# Patient Record
Sex: Male | Born: 1990 | Race: Black or African American | Hispanic: No | Marital: Single | Smoking: Never smoker
Health system: Southern US, Community
[De-identification: ages and names within clinical notes are randomized; demographics above are authoritative.]

## PROBLEM LIST (undated history)

## (undated) DIAGNOSIS — J069 Acute upper respiratory infection, unspecified: Secondary | ICD-10-CM

## (undated) DIAGNOSIS — E079 Disorder of thyroid, unspecified: Secondary | ICD-10-CM

## (undated) DIAGNOSIS — J45909 Unspecified asthma, uncomplicated: Secondary | ICD-10-CM

## (undated) HISTORY — PX: TONSILLECTOMY: SUR1361

## (undated) HISTORY — DX: Acute upper respiratory infection, unspecified: J06.9

## (undated) HISTORY — PX: TONSILECTOMY, ADENOIDECTOMY, BILATERAL MYRINGOTOMY AND TUBES: SHX2538

## (undated) HISTORY — DX: Unspecified asthma, uncomplicated: J45.909

---

## 2010-02-22 ENCOUNTER — Emergency Department (HOSPITAL_BASED_OUTPATIENT_CLINIC_OR_DEPARTMENT_OTHER): Admission: EM | Admit: 2010-02-22 | Discharge: 2010-02-22 | Payer: Self-pay | Admitting: Emergency Medicine

## 2010-11-07 LAB — URINE CULTURE
Colony Count: NO GROWTH
Culture: NO GROWTH

## 2010-11-07 LAB — URINALYSIS, ROUTINE W REFLEX MICROSCOPIC
Bilirubin Urine: NEGATIVE
Ketones, ur: NEGATIVE mg/dL
Leukocytes, UA: NEGATIVE
Nitrite: NEGATIVE
Protein, ur: 30 mg/dL — AB

## 2010-11-07 LAB — URINE MICROSCOPIC-ADD ON

## 2010-11-07 LAB — GC/CHLAMYDIA PROBE AMP, GENITAL: Chlamydia, DNA Probe: NEGATIVE

## 2011-01-26 ENCOUNTER — Emergency Department (HOSPITAL_BASED_OUTPATIENT_CLINIC_OR_DEPARTMENT_OTHER)
Admission: EM | Admit: 2011-01-26 | Discharge: 2011-01-26 | Disposition: A | Discharge status: Home / Self Care | Payer: Self-pay | Attending: Emergency Medicine | Admitting: Emergency Medicine

## 2011-01-26 ENCOUNTER — Emergency Department (INDEPENDENT_AMBULATORY_CARE_PROVIDER_SITE_OTHER): Payer: Self-pay

## 2011-01-26 DIAGNOSIS — R109 Unspecified abdominal pain: Secondary | ICD-10-CM | POA: Insufficient documentation

## 2011-01-26 DIAGNOSIS — R300 Dysuria: Secondary | ICD-10-CM | POA: Insufficient documentation

## 2011-01-26 DIAGNOSIS — R350 Frequency of micturition: Secondary | ICD-10-CM

## 2011-01-26 DIAGNOSIS — N3010 Interstitial cystitis (chronic) without hematuria: Secondary | ICD-10-CM | POA: Insufficient documentation

## 2011-01-26 DIAGNOSIS — R319 Hematuria, unspecified: Secondary | ICD-10-CM

## 2011-01-26 LAB — URINE MICROSCOPIC-ADD ON

## 2011-01-26 LAB — URINALYSIS, ROUTINE W REFLEX MICROSCOPIC
Bilirubin Urine: NEGATIVE
Ketones, ur: NEGATIVE mg/dL
Nitrite: NEGATIVE
Specific Gravity, Urine: 1.025 (ref 1.005–1.030)
pH: 6.5 (ref 5.0–8.0)

## 2011-01-28 LAB — URINE CULTURE
Colony Count: NO GROWTH
Culture  Setup Time: 201206070630

## 2013-02-23 ENCOUNTER — Emergency Department (HOSPITAL_BASED_OUTPATIENT_CLINIC_OR_DEPARTMENT_OTHER): Payer: Self-pay

## 2013-02-23 ENCOUNTER — Emergency Department (HOSPITAL_BASED_OUTPATIENT_CLINIC_OR_DEPARTMENT_OTHER)
Admission: EM | Admit: 2013-02-23 | Discharge: 2013-02-23 | Disposition: A | Discharge status: Home / Self Care | Payer: Self-pay | Attending: Emergency Medicine | Admitting: Emergency Medicine

## 2013-02-23 ENCOUNTER — Encounter (HOSPITAL_BASED_OUTPATIENT_CLINIC_OR_DEPARTMENT_OTHER): Payer: Self-pay | Admitting: *Deleted

## 2013-02-23 DIAGNOSIS — M542 Cervicalgia: Secondary | ICD-10-CM | POA: Insufficient documentation

## 2013-02-23 DIAGNOSIS — M549 Dorsalgia, unspecified: Secondary | ICD-10-CM | POA: Insufficient documentation

## 2013-02-23 MED ORDER — OXYCODONE-ACETAMINOPHEN 5-325 MG PO TABS: 1.0000 | ORAL_TABLET | ORAL | Status: DC | PRN

## 2013-02-23 MED ORDER — DIAZEPAM 5 MG PO TABS: 5.0000 mg | ORAL_TABLET | Freq: Three times a day (TID) | ORAL | Status: DC

## 2013-02-23 MED ORDER — IBUPROFEN 800 MG PO TABS: 800.0000 mg | ORAL_TABLET | Freq: Three times a day (TID) | ORAL | Status: DC

## 2013-02-23 MED ORDER — OXYCODONE-ACETAMINOPHEN 5-325 MG PO TABS
1.0000 | ORAL_TABLET | Freq: Once | ORAL | Status: AC
  Administered 2013-02-23: 1 via ORAL
  Filled 2013-02-23 (×2): qty 1

## 2013-02-23 MED ORDER — DIAZEPAM 5 MG PO TABS
5.0000 mg | ORAL_TABLET | Freq: Once | ORAL | Status: AC
  Administered 2013-02-23: 5 mg via ORAL
  Filled 2013-02-23: qty 1

## 2013-02-23 NOTE — ED Provider Notes (Signed)
   History    CSN: [id number] Arrival date & time 02/23/13  1722  First MD Initiated Contact with Patient 02/23/13 1759     Chief Complaint  Patient presents with  . Shoulder Injury   (Consider location/radiation/quality/duration/timing/severity/associated sxs/prior Treatment) Patient is a 22 y.o. male presenting with shoulder injury. The history is provided by the patient and a relative. No language interpreter was used.  Shoulder Injury Associated symptoms include neck pain. Pertinent negatives include no chills, fever, numbness or weakness. Associated symptoms comments: Left neck and shoulder pain for the past 3 weeks that became sharp and severe today, radiating into left arm. Hurts worse with movement and deep breathing but not short of breath. No fever. No known injury. No extremity weakness, numbness or tingling. Marland Kitchen   History reviewed. No pertinent past medical history. History reviewed. No pertinent past surgical history. History reviewed. No pertinent family history. History  Substance Use Topics  . Smoking status: Never Smoker   . Smokeless tobacco: Not on file  . Alcohol Use: No    Review of Systems  Constitutional: Negative for fever and chills.  HENT: Positive for neck pain.   Respiratory: Negative.  Negative for shortness of breath.   Cardiovascular: Negative.   Musculoskeletal:       See HPI  Neurological: Negative.  Negative for weakness and numbness.    Allergies  Review of patient's allergies indicates no known allergies.  Home Medications  No current outpatient prescriptions on file. BP 160/86  Pulse 52  Temp(Src) 97.9 F (36.6 C) (Oral)  Resp 18  Ht 6\' 1"  (1.854 m)  Wt 230 lb (104.327 kg)  BMI 30.35 kg/m2  SpO2 100% Physical Exam  Constitutional: He is oriented to person, place, and time. He appears well-developed and well-nourished.  Neck: Normal range of motion.  Cardiovascular: Intact distal pulses.   Pulmonary/Chest: Effort normal.   Musculoskeletal: Normal range of motion.  Minimally tender to upper back at paraspinal area. No swelling. No midline or paracervical tenderness. Guarded full range of motion secondary to discomfort. 5/5 grip strength left UE that is equal to right.   Neurological: He is alert and oriented to person, place, and time.  Skin: Skin is warm and dry.  Psychiatric: He has a normal mood and affect.    ED Course  Procedures (including critical care time) Labs Reviewed - No data to display Dg Shoulder Left  02/23/2013   *RADIOLOGY REPORT*  Clinical Data: Cough and shoulder while stretching.  Pain and numbness.  LEFT SHOULDER - 2+ VIEW  Comparison: None.  Findings: No fracture, dislocation, or acute bony findings observed.  IMPRESSION:  No significant abnormality identified.   Original Report Authenticated By: Gaylyn Rong, M.D.   No diagnosis found. 1. Upper back pain MDM  Better with medications. Negative x-ray ordered by protocol. Sharp increase to pain today after 3 week history of pain causes suspicion for muscle spasm type pain. Will treat conservatively.  Arnoldo Hooker, PA-C 02/23/13 1911

## 2013-02-23 NOTE — ED Notes (Signed)
Pt states he has a hx of back pain and was stretching. Felt "pop" in his left shoulder and "collapsed". C/O pain and numbness to left arm. PMS intact.

## 2013-02-24 NOTE — ED Provider Notes (Signed)
Medical screening examination/treatment/procedure(s) were performed by non-physician practitioner and as supervising physician I was immediately available for consultation/collaboration.  Toy Baker, MD 02/24/13 2038

## 2014-05-02 ENCOUNTER — Ambulatory Visit (INDEPENDENT_AMBULATORY_CARE_PROVIDER_SITE_OTHER): Payer: Self-pay | Admitting: Family Medicine

## 2014-05-02 VITALS — BP 132/58 | HR 67 | Temp 97.7°F | Resp 18 | Ht 74.0 in | Wt 214.6 lb

## 2014-05-02 DIAGNOSIS — Z202 Contact with and (suspected) exposure to infections with a predominantly sexual mode of transmission: Secondary | ICD-10-CM

## 2014-05-02 DIAGNOSIS — R300 Dysuria: Secondary | ICD-10-CM

## 2014-05-02 LAB — POCT URINALYSIS DIPSTICK
Bilirubin, UA: NEGATIVE
Glucose, UA: NEGATIVE
Ketones, UA: NEGATIVE
Leukocytes, UA: NEGATIVE
Nitrite, UA: NEGATIVE
PROTEIN UA: NEGATIVE
SPEC GRAV UA: 1.025
UROBILINOGEN UA: 1
pH, UA: 6.5

## 2014-05-02 LAB — POCT UA - MICROSCOPIC ONLY
Bacteria, U Microscopic: NEGATIVE
CRYSTALS, UR, HPF, POC: NEGATIVE
Casts, Ur, LPF, POC: NEGATIVE
Mucus, UA: NEGATIVE
YEAST UA: NEGATIVE

## 2014-05-02 MED ORDER — AZITHROMYCIN 250 MG PO TABS: ORAL_TABLET | ORAL | Status: DC

## 2014-05-02 NOTE — Progress Notes (Signed)
Subjective: 23 year old man with a history 7-10 days of some dysuria. He has noticed a difference in the other. He wonders whether he had a little discharge this morning. He been using a powder and stopped using that and then he noticed this discharge. He's been with same partner for about 3 months. He has had about 6 partner last year. He usually wears condoms, but one time recently it broke. He works as a Production assistant, radio. No history of STDs but does have a history of 3 urinary tract infections in the past. Had HIV testing in July.  Objective:  No acute distress. Normal external genitalia. No discharge is noted. No hernias.  Assessment: Dysuria, suspicious for STD. With his history of prior urinary tract infections that were of uncertain calls, it makes me wonder whether he could have herpes which can sometimes cause dysuria and UTI like symptoms.  Plan: URiprobe hsv  Urinalysis  Results for orders placed in visit on 05/02/14  POCT URINALYSIS DIPSTICK      Result Value Ref Range   Color, UA yellow     Clarity, UA clear     Glucose, UA neg     Bilirubin, UA neg     Ketones, UA neg     Spec Grav, UA 1.025     Blood, UA tr-intact     pH, UA 6.5     Protein, UA neg     Urobilinogen, UA 1.0     Nitrite, UA neg     Leukocytes, UA Negative    POCT UA - MICROSCOPIC ONLY      Result Value Ref Range   WBC, Ur, HPF, POC 0-2     RBC, urine, microscopic 1-6     Bacteria, U Microscopic neg     Mucus, UA neg     Epithelial cells, urine per micros 0-1     Crystals, Ur, HPF, POC neg     Casts, Ur, LPF, POC neg     Yeast, UA neg     I will treat him for a presumed STD tests are pending with 1 g of azithromycin orally. See instructions.

## 2014-05-02 NOTE — Patient Instructions (Signed)
Taking azithromycin for pills in a single dose together with a little food this evening.  If symptoms get worse or are not improving let us know or return for recheck  We will let you know the results of your labs. If you do not hear from [place] by to see give me a call Tuesday morning

## 2014-05-03 LAB — GC/CHLAMYDIA PROBE AMP
CT PROBE, AMP APTIMA: POSITIVE — AB
GC Probe RNA: NEGATIVE

## 2014-05-06 LAB — HSV(HERPES SIMPLEX VRS) I + II AB-IGG
HSV 1 Glycoprotein G Ab, IgG: 6.76 IV — ABNORMAL HIGH
HSV 2 GLYCOPROTEIN G AB, IGG: 0.53 IV

## 2014-05-13 ENCOUNTER — Telehealth: Payer: Self-pay

## 2014-05-13 NOTE — Telephone Encounter (Signed)
Pt given lab results today 

## 2014-05-13 NOTE — Telephone Encounter (Signed)
Pt wants to speak to someone about his lab results.  [phone]

## 2014-05-14 ENCOUNTER — Telehealth: Payer: Self-pay

## 2014-05-14 DIAGNOSIS — Z202 Contact with and (suspected) exposure to infections with a predominantly sexual mode of transmission: Secondary | ICD-10-CM

## 2014-05-14 NOTE — Telephone Encounter (Signed)
Patient would like his lab results.  Patient states he called two days ago and still hasn't heard.  Patient saw Dr. Alwyn Ren on 05/02/17.  His best # is [phone].

## 2014-05-14 NOTE — Telephone Encounter (Signed)
Spoke with pt. He is still having urinary symptoms after taking the azithromycin, but can't afford another OV because he has no insurance. Wants to know if we can recheck his urine to make sure the chlamydia is cured. Please advise. Thanks

## 2014-05-16 ENCOUNTER — Other Ambulatory Visit (INDEPENDENT_AMBULATORY_CARE_PROVIDER_SITE_OTHER): Payer: Self-pay | Admitting: *Deleted

## 2014-05-16 DIAGNOSIS — Z202 Contact with and (suspected) exposure to infections with a predominantly sexual mode of transmission: Secondary | ICD-10-CM

## 2014-05-16 NOTE — Telephone Encounter (Signed)
Spoke to pt- he is going to come in today to provide the urine sample. Labs have been ordered

## 2014-05-16 NOTE — Telephone Encounter (Signed)
Arrange for him to come in for a lab only Uriprobe to test for cure of Chlamydia.  If still positive I want him to come in to discuss for treatment.

## 2014-05-17 LAB — GC/CHLAMYDIA PROBE AMP
CT PROBE, AMP APTIMA: NEGATIVE
GC Probe RNA: NEGATIVE

## 2014-12-24 ENCOUNTER — Encounter (HOSPITAL_COMMUNITY): Payer: Self-pay

## 2014-12-24 ENCOUNTER — Emergency Department (HOSPITAL_COMMUNITY): Payer: Self-pay

## 2014-12-24 ENCOUNTER — Emergency Department (HOSPITAL_COMMUNITY)
Admission: EM | Admit: 2014-12-24 | Discharge: 2014-12-25 | Disposition: A | Discharge status: Home / Self Care | Payer: Self-pay | Attending: Emergency Medicine | Admitting: Emergency Medicine

## 2014-12-24 DIAGNOSIS — S032XXA Dislocation of tooth, initial encounter: Secondary | ICD-10-CM

## 2014-12-24 DIAGNOSIS — W01198A Fall on same level from slipping, tripping and stumbling with subsequent striking against other object, initial encounter: Secondary | ICD-10-CM | POA: Insufficient documentation

## 2014-12-24 DIAGNOSIS — S01511A Laceration without foreign body of lip, initial encounter: Secondary | ICD-10-CM

## 2014-12-24 DIAGNOSIS — S0990XA Unspecified injury of head, initial encounter: Secondary | ICD-10-CM

## 2014-12-24 DIAGNOSIS — Y9367 Activity, basketball: Secondary | ICD-10-CM | POA: Insufficient documentation

## 2014-12-24 DIAGNOSIS — Y998 Other external cause status: Secondary | ICD-10-CM | POA: Insufficient documentation

## 2014-12-24 DIAGNOSIS — Z23 Encounter for immunization: Secondary | ICD-10-CM | POA: Insufficient documentation

## 2014-12-24 DIAGNOSIS — Z79899 Other long term (current) drug therapy: Secondary | ICD-10-CM | POA: Insufficient documentation

## 2014-12-24 DIAGNOSIS — W19XXXA Unspecified fall, initial encounter: Secondary | ICD-10-CM

## 2014-12-24 DIAGNOSIS — R55 Syncope and collapse: Secondary | ICD-10-CM | POA: Insufficient documentation

## 2014-12-24 DIAGNOSIS — Y92310 Basketball court as the place of occurrence of the external cause: Secondary | ICD-10-CM | POA: Insufficient documentation

## 2014-12-24 NOTE — ED Notes (Signed)
Patient tooth placed in milk on arrival to ER. Patient tooth moved to "tooth saver" at bedside.

## 2014-12-24 NOTE — ED Notes (Signed)
Patient was playing basketball and "passed out" per girl friend, he hit his head on metal bleachers, his right upper tooth was dislodged, and he has a laceration to right upper lip. At this time patient is not able to remember todays events and this weeks events.

## 2014-12-24 NOTE — ED Notes (Signed)
Patient starting to remember todays date and place.   Friend at bedside states patient tripped with another player and hit his head onto bleachers. Friend states patient was A&O after injury and became confused and lethargic PTA.

## 2014-12-24 NOTE — ED Notes (Signed)
MD at bedside. 

## 2014-12-25 MED ORDER — LIDOCAINE-EPINEPHRINE 1 %-1:100000 IJ SOLN
INTRAMUSCULAR | Status: AC
  Filled 2014-12-25: qty 1

## 2014-12-25 MED ORDER — LIDOCAINE-EPINEPHRINE (PF) 2 %-1:200000 IJ SOLN
10.0000 mL | Freq: Once | INTRAMUSCULAR | Status: DC
  Filled 2014-12-25: qty 10

## 2014-12-25 MED ORDER — TETANUS-DIPHTH-ACELL PERTUSSIS 5-2.5-18.5 LF-MCG/0.5 IM SUSP
0.5000 mL | Freq: Once | INTRAMUSCULAR | Status: AC
  Administered 2014-12-25: 0.5 mL via INTRAMUSCULAR
  Filled 2014-12-25: qty 0.5

## 2014-12-25 NOTE — ED Notes (Signed)
Patient verbalizes understanding of discharge instructions, home care and follow up care. Patient out of department at this time with family. 

## 2014-12-25 NOTE — Discharge Instructions (Signed)
Concussion  A concussion, or closed-head injury, is a brain injury caused by a direct blow to the head or by a quick and sudden movement (jolt) of the head or neck. Concussions are usually not life-threatening. Even so, the effects of a concussion can be serious. If you have had a concussion before, you are more likely to experience concussion-like symptoms after a direct blow to the head.   CAUSES  · Direct blow to the head, such as from running into another player during a soccer game, being hit in a fight, or hitting your head on a hard surface.  · A jolt of the head or neck that causes the brain to move back and forth inside the skull, such as in a car crash.  SIGNS AND SYMPTOMS  The signs of a concussion can be hard to notice. Early on, they may be missed by you, family members, and health care providers. You may look fine but act or feel differently.  Symptoms are usually temporary, but they may last for days, weeks, or even longer. Some symptoms may appear right away while others may not show up for hours or days. Every head injury is different. Symptoms include:  · Mild to moderate headaches that will not go away.  · A feeling of pressure inside your head.  · Having more trouble than usual:  ¨ Learning or remembering things you have heard.  ¨ Answering questions.  ¨ Paying attention or concentrating.  ¨ Organizing daily tasks.  ¨ Making decisions and solving problems.  · Slowness in thinking, acting or reacting, speaking, or reading.  · Getting lost or being easily confused.  · Feeling tired all the time or lacking energy (fatigued).  · Feeling drowsy.  · Sleep disturbances.  ¨ Sleeping more than usual.  ¨ Sleeping less than usual.  ¨ Trouble falling asleep.  ¨ Trouble sleeping (insomnia).  · Loss of balance or feeling lightheaded or dizzy.  · Nausea or vomiting.  · Numbness or tingling.  · Increased sensitivity to:  ¨ Sounds.  ¨ Lights.  ¨ Distractions.  · Vision problems or eyes that tire  easily.  · Diminished sense of taste or smell.  · Ringing in the ears.  · Mood changes such as feeling sad or anxious.  · Becoming easily irritated or angry for little or no reason.  · Lack of motivation.  · Seeing or hearing things other people do not see or hear (hallucinations).  DIAGNOSIS  Your health care provider can usually diagnose a concussion based on a description of your injury and symptoms. He or she will ask whether you passed out (lost consciousness) and whether you are having trouble remembering events that happened right before and during your injury.  Your evaluation might include:  · A brain scan to look for signs of injury to the brain. Even if the test shows no injury, you may still have a concussion.  · Blood tests to be sure other problems are not present.  TREATMENT  · Concussions are usually treated in an emergency department, in urgent care, or at a clinic. You may need to stay in the hospital overnight for further treatment.  · Tell your health care provider if you are taking any medicines, including prescription medicines, over-the-counter medicines, and natural remedies. Some medicines, such as blood thinners (anticoagulants) and aspirin, may increase the chance of complications. Also tell your health care provider whether you have had alcohol or are taking illegal drugs. This information   may affect treatment.  · Your health care provider will send you home with important instructions to follow.  · How fast you will recover from a concussion depends on many factors. These factors include how severe your concussion is, what part of your brain was injured, your age, and how healthy you were before the concussion.  · Most people with mild injuries recover fully. Recovery can take time. In general, recovery is slower in older persons. Also, persons who have had a concussion in the past or have other medical problems may find that it takes longer to recover from their current injury.  HOME  CARE INSTRUCTIONS  General Instructions  · Carefully follow the directions your health care provider gave you.  · Only take over-the-counter or prescription medicines for pain, discomfort, or fever as directed by your health care provider.  · Take only those medicines that your health care provider has approved.  · Do not drink alcohol until your health care provider says you are well enough to do so. Alcohol and certain other drugs may slow your recovery and can put you at risk of further injury.  · If it is harder than usual to remember things, write them down.  · If you are easily distracted, try to do one thing at a time. For example, do not try to watch TV while fixing dinner.  · Talk with family members or close friends when making important decisions.  · Keep all follow-up appointments. Repeated evaluation of your symptoms is recommended for your recovery.  · Watch your symptoms and tell others to do the same. Complications sometimes occur after a concussion. Older adults with a brain injury may have a higher risk of serious complications, such as a blood clot on the brain.  · Tell your teachers, school nurse, school counselor, coach, athletic trainer, or work manager about your injury, symptoms, and restrictions. Tell them about what you can or cannot do. They should watch for:  ¨ Increased problems with attention or concentration.  ¨ Increased difficulty remembering or learning new information.  ¨ Increased time needed to complete tasks or assignments.  ¨ Increased irritability or decreased ability to cope with stress.  ¨ Increased symptoms.  · Rest. Rest helps the brain to heal. Make sure you:  ¨ Get plenty of sleep at night. Avoid staying up late at night.  ¨ Keep the same bedtime hours on weekends and weekdays.  ¨ Rest during the day. Take daytime naps or rest breaks when you feel tired.  · Limit activities that require a lot of thought or concentration. These include:  ¨ Doing homework or job-related  work.  ¨ Watching TV.  ¨ Working on the computer.  · Avoid any situation where there is potential for another head injury (football, hockey, soccer, basketball, martial arts, downhill snow sports and horseback riding). Your condition will get worse every time you experience a concussion. You should avoid these activities until you are evaluated by the appropriate follow-up health care providers.  Returning To Your Regular Activities  You will need to return to your normal activities slowly, not all at once. You must give your body and brain enough time for recovery.  · Do not return to sports or other athletic activities until your health care provider tells you it is safe to do so.  · Ask your health care provider when you can drive, ride a bicycle, or operate heavy machinery. Your ability to react may be slower after a   brain injury. Never do these activities if you are dizzy.  · Ask your health care provider about when you can return to work or school.  Preventing Another Concussion  It is very important to avoid another brain injury, especially before you have recovered. In rare cases, another injury can lead to permanent brain damage, brain swelling, or death. The risk of this is greatest during the first 7-10 days after a head injury. Avoid injuries by:  · Wearing a seat belt when riding in a car.  · Drinking alcohol only in moderation.  · Wearing a helmet when biking, skiing, skateboarding, skating, or doing similar activities.  · Avoiding activities that could lead to a second concussion, such as contact or recreational sports, until your health care provider says it is okay.  · Taking safety measures in your home.  ¨ Remove clutter and tripping hazards from floors and stairways.  ¨ Use grab bars in bathrooms and handrails by stairs.  ¨ Place non-slip mats on floors and in bathtubs.  ¨ Improve lighting in dim areas.  SEEK MEDICAL CARE IF:  · You have increased problems paying attention or  concentrating.  · You have increased difficulty remembering or learning new information.  · You need more time to complete tasks or assignments than before.  · You have increased irritability or decreased ability to cope with stress.  · You have more symptoms than before.  Seek medical care if you have any of the following symptoms for more than 2 weeks after your injury:  · Lasting (chronic) headaches.  · Dizziness or balance problems.  · Nausea.  · Vision problems.  · Increased sensitivity to noise or light.  · Depression or mood swings.  · Anxiety or irritability.  · Memory problems.  · Difficulty concentrating or paying attention.  · Sleep problems.  · Feeling tired all the time.  SEEK IMMEDIATE MEDICAL CARE IF:  · You have severe or worsening headaches. These may be a sign of a blood clot in the brain.  · You have weakness (even if only in one hand, leg, or part of the face).  · You have numbness.  · You have decreased coordination.  · You vomit repeatedly.  · You have increased sleepiness.  · One pupil is larger than the other.  · You have convulsions.  · You have slurred speech.  · You have increased confusion. This may be a sign of a blood clot in the brain.  · You have increased restlessness, agitation, or irritability.  · You are unable to recognize people or places.  · You have neck pain.  · It is difficult to wake you up.  · You have unusual behavior changes.  · You lose consciousness.  MAKE SURE YOU:  · Understand these instructions.  · Will watch your condition.  · Will get help right away if you are not doing well or get worse.  Document Released: 10/29/2003 Document Revised: 08/13/2013 Document Reviewed: 02/28/2013  [employer]® Patient Information ©2015 [employer]. This information is not intended to replace advice given to you by your health care provider. Make sure you discuss any questions you have with your health care provider.

## 2014-12-25 NOTE — ED Provider Notes (Signed)
CSN: [id number]     Arrival date & time 12/24/14  2232 History   First MD Initiated Contact with Patient 12/24/14 2342     Chief Complaint  Patient presents with  . Loss of Consciousness     (Consider location/radiation/quality/duration/timing/severity/associated sxs/prior Treatment) HPI Comments: Patient presents today due to head injury.  He reports that he was playing basketball just prior to arrival.  He jumped up to get the ball and collided with another player.  This caused him to fall and hit his face on a bleacher.  This caused a laceration of the upper lip and also avulsion of the right upper incisor tooth.  His girlfriend witnessed the fall and reports that he never loss consciousness.  However, en route to the ED he became confused and groggy.  He is denying any pain aside from his upper lip at this time.  He is unsure of the date of his last tetanus.  He does not have a dentist.  He denies vision changes, nausea, or vomiting.  He is not on any anticoagulants.    The history is provided by the patient.    History reviewed. No pertinent past medical history. Past Surgical History  Procedure Laterality Date  . Tonsilectomy, adenoidectomy, bilateral myringotomy and tubes     Family History  Problem Relation Age of Onset  . Heart disease Mother   . Hypertension Mother    History  Substance Use Topics  . Smoking status: Never Smoker   . Smokeless tobacco: Not on file  . Alcohol Use: Yes    Review of Systems  All other systems reviewed and are negative.     Allergies  Review of patient's allergies indicates no known allergies.  Home Medications   Prior to Admission medications   Medication Sig Start Date End Date Taking? Authorizing Provider  Multiple Vitamin (MULTIVITAMIN WITH MINERALS) TABS tablet Take 1 tablet by mouth daily.   Yes Historical Provider, MD  azithromycin (ZITHROMAX) 250 MG tablet Take 4 pills together in a single dose tonight for possible  STD Patient not taking: Reported on 12/24/2014 05/02/14   Peyton Najjar, MD   BP 159/85 mmHg  Pulse 67  Temp(Src) 97.8 F (36.6 C) (Axillary)  Resp 18  SpO2 99% Physical Exam  Constitutional: He appears well-developed and well-nourished.  HENT:  Head: Normocephalic.  Mouth/Throat:    3 cm widely gaping laceration of the right upper lip extending from just superior to the vermilion border and through to the oral mucosa. Laceration actively bleeding.  No trismus of the jaw.    Eyes: EOM are normal. Pupils are equal, round, and reactive to light.  Neck: Neck supple.  Cardiovascular: Normal rate, regular rhythm and normal heart sounds.   Pulmonary/Chest: Effort normal and breath sounds normal.  Musculoskeletal:  Full ROM of all extremities without pain  Neurological: He is alert. He has normal strength. No cranial nerve deficit or sensory deficit.  Skin: Skin is warm and dry.  Psychiatric: He has a normal mood and affect.  Nursing note and vitals reviewed.   ED Course  Procedures (including critical care time) Labs Review Labs Reviewed - No data to display  Imaging Review Ct Head Wo Contrast  12/24/2014   CLINICAL DATA:  Syncope, striking head on metal bleed years. Amnesia for the event.  EXAM: CT HEAD WITHOUT CONTRAST  CT MAXILLOFACIAL WITHOUT CONTRAST  CT CERVICAL SPINE WITHOUT CONTRAST  TECHNIQUE: Multidetector CT imaging of the head, cervical spine, and maxillofacial  structures were performed using the standard protocol without intravenous contrast. Multiplanar CT image reconstructions of the cervical spine and maxillofacial structures were also generated.  COMPARISON:  None.  FINDINGS: CT HEAD FINDINGS  There is no intracranial hemorrhage, mass or evidence of acute infarction. Gray matter and white matter are normal. The ventricles and basal cisterns appear unremarkable.  The bony structures are intact. The visible portions of the paranasal sinuses are clear.  CT MAXILLOFACIAL  FINDINGS  No facial fractures are evident. Orbital floors are intact. Zygomatic arches and pterygoid plates are intact. Mandible and TMJs are intact.  CT CERVICAL SPINE FINDINGS  The vertebral column, pedicles and facet articulations are intact. There is no evidence of acute fracture. No acute soft tissue abnormalities are evident.  No arthritic changes are evident.  IMPRESSION: *Negative for acute intracranial traumatic injury.  Normal brain. *Negative for acute cervical spine fracture *Negative for acute maxillofacial fracture   Electronically Signed   By: Ellery Plunkaniel R Mitchell M.D.   On: 12/24/2014 23:30   Ct Cervical Spine Wo Contrast  12/24/2014   CLINICAL DATA:  Syncope, striking head on metal bleed years. Amnesia for the event.  EXAM: CT HEAD WITHOUT CONTRAST  CT MAXILLOFACIAL WITHOUT CONTRAST  CT CERVICAL SPINE WITHOUT CONTRAST  TECHNIQUE: Multidetector CT imaging of the head, cervical spine, and maxillofacial structures were performed using the standard protocol without intravenous contrast. Multiplanar CT image reconstructions of the cervical spine and maxillofacial structures were also generated.  COMPARISON:  None.  FINDINGS: CT HEAD FINDINGS  There is no intracranial hemorrhage, mass or evidence of acute infarction. Gray matter and white matter are normal. The ventricles and basal cisterns appear unremarkable.  The bony structures are intact. The visible portions of the paranasal sinuses are clear.  CT MAXILLOFACIAL FINDINGS  No facial fractures are evident. Orbital floors are intact. Zygomatic arches and pterygoid plates are intact. Mandible and TMJs are intact.  CT CERVICAL SPINE FINDINGS  The vertebral column, pedicles and facet articulations are intact. There is no evidence of acute fracture. No acute soft tissue abnormalities are evident.  No arthritic changes are evident.  IMPRESSION: *Negative for acute intracranial traumatic injury.  Normal brain. *Negative for acute cervical spine fracture  *Negative for acute maxillofacial fracture   Electronically Signed   By: Ellery Plunkaniel R Mitchell M.D.   On: 12/24/2014 23:30   Ct Maxillofacial Wo Cm  12/24/2014   CLINICAL DATA:  Syncope, striking head on metal bleed years. Amnesia for the event.  EXAM: CT HEAD WITHOUT CONTRAST  CT MAXILLOFACIAL WITHOUT CONTRAST  CT CERVICAL SPINE WITHOUT CONTRAST  TECHNIQUE: Multidetector CT imaging of the head, cervical spine, and maxillofacial structures were performed using the standard protocol without intravenous contrast. Multiplanar CT image reconstructions of the cervical spine and maxillofacial structures were also generated.  COMPARISON:  None.  FINDINGS: CT HEAD FINDINGS  There is no intracranial hemorrhage, mass or evidence of acute infarction. Gray matter and white matter are normal. The ventricles and basal cisterns appear unremarkable.  The bony structures are intact. The visible portions of the paranasal sinuses are clear.  CT MAXILLOFACIAL FINDINGS  No facial fractures are evident. Orbital floors are intact. Zygomatic arches and pterygoid plates are intact. Mandible and TMJs are intact.  CT CERVICAL SPINE FINDINGS  The vertebral column, pedicles and facet articulations are intact. There is no evidence of acute fracture. No acute soft tissue abnormalities are evident.  No arthritic changes are evident.  IMPRESSION: *Negative for acute intracranial traumatic injury.  Normal  brain. *Negative for acute cervical spine fracture *Negative for acute maxillofacial fracture   Electronically Signed   By: Ellery Plunkaniel R Mitchell M.D.   On: 12/24/2014 23:30     EKG Interpretation None     LACERATION REPAIR Performed by: Santiago GladLaisure, Lamanda Rudder Authorized by: Santiago GladLaisure, Tanise Russman Consent: Verbal consent obtained. Risks and benefits: risks, benefits and alternatives were discussed Consent given by: patient Patient identity confirmed: provided demographic data Prepped and Draped in normal sterile fashion Wound explored  Laceration  Location: lip  Laceration Length: 3 cm  No Foreign Bodies seen or palpated  Anesthesia: local infiltration  Local anesthetic: lidocaine 2% with epinephrine  Anesthetic total: 3 ml  Irrigation method: syringe Amount of cleaning: standard  Skin closure: 4-0   Number of sutures: 10  Technique: simple interrupted   Patient tolerance: Patient tolerated the procedure well with no immediate complications.  12:30 AM Patient back to baseline at this time.    MDM   Final diagnoses:  Fall   Patient presents today with a head injury.  He hit his mouth on bleachers after a collision while playing basketball.  CT head, cervical spine, and maxillofacial is negative.  He has a normal neurological exam.  Tetanus UTD.  Lip laceration repaired in the ED.  Attempt was made to cement the avulsed tooth to the tooth next to it, but was unsuccessful.  Patient referred to Dentist on call.  Patient returned to baseline mental status.  Feel that the patient is stable for discharge.  Return precautions given.    Santiago GladHeather Sima Lindenberger, PA-C 12/26/14 [id number]  Marisa Severinlga Otter, MD 12/26/14 [phone]

## 2019-09-19 DIAGNOSIS — Z20828 Contact with and (suspected) exposure to other viral communicable diseases: Secondary | ICD-10-CM | POA: Diagnosis not present

## 2019-09-19 DIAGNOSIS — Z03818 Encounter for observation for suspected exposure to other biological agents ruled out: Secondary | ICD-10-CM | POA: Diagnosis not present

## 2020-07-27 DIAGNOSIS — R110 Nausea: Secondary | ICD-10-CM | POA: Diagnosis not present

## 2020-07-27 DIAGNOSIS — R519 Headache, unspecified: Secondary | ICD-10-CM | POA: Diagnosis not present

## 2020-07-27 DIAGNOSIS — Z20822 Contact with and (suspected) exposure to covid-19: Secondary | ICD-10-CM | POA: Diagnosis not present

## 2020-07-27 DIAGNOSIS — Z1159 Encounter for screening for other viral diseases: Secondary | ICD-10-CM | POA: Diagnosis not present

## 2020-08-03 DIAGNOSIS — U071 COVID-19: Secondary | ICD-10-CM | POA: Diagnosis not present

## 2020-08-03 DIAGNOSIS — M791 Myalgia, unspecified site: Secondary | ICD-10-CM | POA: Diagnosis not present

## 2020-08-03 DIAGNOSIS — Z20822 Contact with and (suspected) exposure to covid-19: Secondary | ICD-10-CM | POA: Diagnosis not present

## 2020-08-03 DIAGNOSIS — R509 Fever, unspecified: Secondary | ICD-10-CM | POA: Diagnosis not present

## 2020-08-08 ENCOUNTER — Other Ambulatory Visit: Payer: Self-pay

## 2020-08-08 ENCOUNTER — Emergency Department (HOSPITAL_COMMUNITY): Payer: BC Managed Care – PPO

## 2020-08-08 ENCOUNTER — Emergency Department (HOSPITAL_COMMUNITY)
Admission: EM | Admit: 2020-08-08 | Discharge: 2020-08-08 | Disposition: A | Discharge status: Home / Self Care | Payer: BC Managed Care – PPO | Attending: Emergency Medicine | Admitting: Emergency Medicine

## 2020-08-08 ENCOUNTER — Encounter (HOSPITAL_COMMUNITY): Payer: Self-pay

## 2020-08-08 DIAGNOSIS — R110 Nausea: Secondary | ICD-10-CM | POA: Insufficient documentation

## 2020-08-08 DIAGNOSIS — J90 Pleural effusion, not elsewhere classified: Secondary | ICD-10-CM | POA: Diagnosis not present

## 2020-08-08 DIAGNOSIS — U071 COVID-19: Secondary | ICD-10-CM | POA: Insufficient documentation

## 2020-08-08 DIAGNOSIS — Z5321 Procedure and treatment not carried out due to patient leaving prior to being seen by health care provider: Secondary | ICD-10-CM | POA: Insufficient documentation

## 2020-08-08 DIAGNOSIS — R0602 Shortness of breath: Secondary | ICD-10-CM | POA: Diagnosis not present

## 2020-08-08 NOTE — ED Notes (Signed)
Patient states he is leaving, stickers taken.

## 2020-08-08 NOTE — ED Triage Notes (Signed)
Patient states that he was diagnosed with covid 9 days ago and having SOB. Patient alert and oriented, speaking full sentences. Complains that it hurts during inspiration

## 2020-08-20 DIAGNOSIS — Z1152 Encounter for screening for COVID-19: Secondary | ICD-10-CM | POA: Diagnosis not present

## 2020-12-28 ENCOUNTER — Other Ambulatory Visit: Payer: Self-pay

## 2020-12-29 ENCOUNTER — Encounter: Payer: Self-pay | Admitting: Emergency Medicine

## 2020-12-29 ENCOUNTER — Other Ambulatory Visit: Payer: Self-pay

## 2020-12-29 ENCOUNTER — Ambulatory Visit (INDEPENDENT_AMBULATORY_CARE_PROVIDER_SITE_OTHER): Payer: BC Managed Care – PPO | Admitting: Emergency Medicine

## 2020-12-29 VITALS — BP 118/80 | HR 60 | Temp 98.5°F | Ht 74.0 in | Wt 269.0 lb

## 2020-12-29 DIAGNOSIS — F32A Depression, unspecified: Secondary | ICD-10-CM | POA: Diagnosis not present

## 2020-12-29 DIAGNOSIS — N469 Male infertility, unspecified: Secondary | ICD-10-CM | POA: Diagnosis not present

## 2020-12-29 DIAGNOSIS — Z7689 Persons encountering health services in other specified circumstances: Secondary | ICD-10-CM

## 2020-12-29 LAB — CBC WITH DIFFERENTIAL/PLATELET
Basophils Absolute: 0 10*3/uL (ref 0.0–0.1)
Basophils Relative: 0.9 % (ref 0.0–3.0)
Eosinophils Absolute: 0.1 10*3/uL (ref 0.0–0.7)
Eosinophils Relative: 2.6 % (ref 0.0–5.0)
HCT: 43.8 % (ref 39.0–52.0)
Hemoglobin: 15.3 g/dL (ref 13.0–17.0)
Lymphocytes Relative: 50.6 % — ABNORMAL HIGH (ref 12.0–46.0)
Lymphs Abs: 2.3 10*3/uL (ref 0.7–4.0)
MCHC: 35 g/dL (ref 30.0–36.0)
MCV: 82.2 fl (ref 78.0–100.0)
Monocytes Absolute: 0.3 10*3/uL (ref 0.1–1.0)
Monocytes Relative: 5.7 % (ref 3.0–12.0)
Neutro Abs: 1.8 10*3/uL (ref 1.4–7.7)
Neutrophils Relative %: 40.2 % — ABNORMAL LOW (ref 43.0–77.0)
Platelets: 165 10*3/uL (ref 150.0–400.0)
RBC: 5.33 Mil/uL (ref 4.22–5.81)
RDW: 13.6 % (ref 11.5–15.5)
WBC: 4.6 10*3/uL (ref 4.0–10.5)

## 2020-12-29 LAB — COMPREHENSIVE METABOLIC PANEL
ALT: 22 U/L (ref 0–53)
AST: 16 U/L (ref 0–37)
Albumin: 4.5 g/dL (ref 3.5–5.2)
Alkaline Phosphatase: 44 U/L (ref 39–117)
BUN: 16 mg/dL (ref 6–23)
CO2: 28 mEq/L (ref 19–32)
Calcium: 9.8 mg/dL (ref 8.4–10.5)
Chloride: 102 mEq/L (ref 96–112)
Creatinine, Ser: 0.97 mg/dL (ref 0.40–1.50)
GFR: 104.19 mL/min (ref >60.00)
Glucose, Bld: 98 mg/dL (ref 70–99)
Potassium: 4 mEq/L (ref 3.5–5.1)
Sodium: 137 mEq/L (ref 135–145)
Total Bilirubin: 1.1 mg/dL (ref 0.2–1.2)
Total Protein: 7.8 g/dL (ref 6.0–8.3)

## 2020-12-29 LAB — LIPID PANEL
Cholesterol: 265 mg/dL — ABNORMAL HIGH (ref 0–200)
HDL: 45.7 mg/dL (ref >39.00)
NonHDL: 219.09
Total CHOL/HDL Ratio: 6
Triglycerides: 217 mg/dL — ABNORMAL HIGH (ref 0.0–149.0)
VLDL: 43.4 mg/dL — ABNORMAL HIGH (ref 0.0–40.0)

## 2020-12-29 LAB — HEMOGLOBIN A1C: Hgb A1c MFr Bld: 5.7 % (ref 4.6–6.5)

## 2020-12-29 LAB — TSH: TSH: 6.03 u[IU]/mL — ABNORMAL HIGH (ref 0.35–4.50)

## 2020-12-29 LAB — LDL CHOLESTEROL, DIRECT: Direct LDL: 190 mg/dL

## 2020-12-29 NOTE — Assessment & Plan Note (Signed)
Needs referral to behavioral health.  May need medication.

## 2020-12-29 NOTE — Progress Notes (Signed)
Brian Webster 30 y.o.   Chief Complaint  Patient presents with  . New Patient (Initial Visit)    Pt would a physical    HISTORY OF PRESENT ILLNESS: This is a 30 y.o. male first visit to this office, here to establish care with me. Denies any chronic medical problems however has 3 concerns: 1.  Needs referral for mental therapy.  Complaining of chronic depression and anxiety for "my whole life". 2.  Chronic allergies mostly seasonal, may need referral to allergist 3.  Fertility issues.  Wife will be getting tested.  Requesting to be tested himself. Non-smoker.  No EtOH abuse.  Healthy lifestyle. No other complaints or medical concerns today.  HPI    Prior to Admission medications   Medication Sig Start Date End Date Taking? Authorizing Provider  Multiple Vitamin (MULTIVITAMIN WITH MINERALS) TABS tablet Take 1 tablet by mouth daily.   Yes [provider]  azithromycin (ZITHROMAX) 250 MG tablet Take 4 pills together in a single dose tonight for possible STD Patient not taking: No sig reported 05/02/14   Peyton Najjar, MD    No Known Allergies  There are no problems to display for this patient.   History reviewed. No pertinent past medical history.  Past Surgical History:  Procedure Laterality Date  . TONSILECTOMY, ADENOIDECTOMY, BILATERAL MYRINGOTOMY AND TUBES      Social History   Socioeconomic History  . Marital status: Single    Spouse name: Not on file  . Number of children: Not on file  . Years of education: Not on file  . Highest education level: Not on file  Occupational History  . Not on file  Tobacco Use  . Smoking status: Never Smoker  . Smokeless tobacco: Not on file  Substance and Sexual Activity  . Alcohol use: Yes  . Drug use: No  . Sexual activity: Yes  Other Topics Concern  . Not on file  Social History Narrative  . Not on file   Social Determinants of Health   Financial Resource Strain: Not on file  Food Insecurity: Not on file   Transportation Needs: Not on file  Physical Activity: Not on file  Stress: Not on file  Social Connections: Not on file  Intimate Partner Violence: Not on file    Family History  Problem Relation Age of Onset  . Heart disease Mother   . Hypertension Mother      Review of Systems  Constitutional: Negative.  Negative for chills and fever.  HENT: Negative.  Negative for congestion and sore throat.   Respiratory: Negative for cough and shortness of breath.   Cardiovascular: Negative.  Negative for chest pain and palpitations.  Gastrointestinal: Negative.  Negative for abdominal pain, blood in stool, diarrhea, melena, nausea and vomiting.  Genitourinary: Negative.  Negative for dysuria.  Skin: Negative.  Negative for rash.  Neurological: Negative.  Negative for dizziness and headaches.  All other systems reviewed and are negative.  Today's Vitals   12/29/20 1511  BP: 118/80  Pulse: 60  Temp: 98.5 F (36.9 C)  TempSrc: Oral  SpO2: 98%  Weight: 269 lb (122 kg)  Height: 6\' 2"  (1.88 m)   Body mass index is 34.54 kg/m.   Physical Exam Vitals reviewed.  Constitutional:      Appearance: Normal appearance.  HENT:     Head: Normocephalic.     Right Ear: Tympanic membrane, ear canal and external ear normal.     Left Ear: Tympanic membrane, ear canal  and external ear normal.  Eyes:     Extraocular Movements: Extraocular movements intact.     Conjunctiva/sclera: Conjunctivae normal.     Pupils: Pupils are equal, round, and reactive to light.  Cardiovascular:     Rate and Rhythm: Normal rate and regular rhythm.     Pulses: Normal pulses.     Heart sounds: Normal heart sounds.  Pulmonary:     Effort: Pulmonary effort is normal.     Breath sounds: Normal breath sounds.  Musculoskeletal:        General: Normal range of motion.     Cervical back: Normal range of motion and neck supple.  Skin:    General: Skin is warm and dry.     Capillary Refill: Capillary refill takes  less than 2 seconds.  Neurological:     General: No focal deficit present.     Mental Status: He is alert and oriented to person, place, and time.  Psychiatric:        Mood and Affect: Mood normal.        Behavior: Behavior normal.      ASSESSMENT & PLAN: A total of 30 minutes was spent with the patient and counseling/coordination of care regarding establishing care with me, review of past medical history, chronic depression and management, need for referral to psychiatrist, fertility and need for urology evaluation, need for blood work today, education on nutrition, health maintenance items, prognosis, documentation and need for follow-up.   Chronic depression Needs referral to behavioral health.  May need medication.  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  Brian Webster was seen today for new patient (initial visit).  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 Ask your health care provider about:  The right schedule for you to have regular tests and exams.  Things you can do on your own to prevent diseases and keep yourself healthy. What should I know about diet, weight, and exercise? Eat a healthy diet  Eat a diet that includes plenty of vegetables, fruits, low-fat dairy products, and lean protein.  Do not eat a lot of foods that are high in solid fats, added sugars, or sodium.   Maintain a healthy weight Body mass index (BMI) is a  measurement that can be used to identify possible weight problems. It estimates body fat based on height and weight. Your health care provider can help determine your BMI and help you achieve or maintain a healthy weight. Get regular exercise Get regular exercise. This is one of the most important things you can do for your health. Most adults should:  Exercise for at least 150 minutes each week. The exercise should increase your heart rate and make you sweat (moderate-intensity exercise).  Do strengthening exercises at least twice a week. This is in addition to the moderate-intensity exercise.  Spend less time  sitting. Even light physical activity can be beneficial. Watch cholesterol and blood lipids Have your blood tested for lipids and cholesterol at 31 years of age, then have this test every 5 years. You may need to have your cholesterol levels checked more often if:  Your lipid or cholesterol levels are high.  You are older than 31 years of age.  You are at high risk for heart disease. What should I know about cancer screening? Many types of cancers can be detected early and may often be prevented. Depending on your health history and family history, you may need to have cancer screening at various ages. This may include screening for:  Colorectal cancer.  Prostate cancer.  Skin cancer.  Lung cancer. What should I know about heart disease, diabetes, and high blood pressure? Blood pressure and heart disease  High blood pressure causes heart disease and increases the risk of stroke. This is more likely to develop in people who have high blood pressure readings, are of African descent, or are overweight.  Talk with your health care provider about your target blood pressure readings.  Have your blood pressure checked: ? Every 3-5 years if you are 69-47 years of age. ? Every year if you are 69 years old or older.  If you are between the ages of 56 and 37 and are a current or  former smoker, ask your health care provider if you should have a one-time screening for abdominal aortic aneurysm (AAA). Diabetes Have regular diabetes screenings. This checks your fasting blood sugar level. Have the screening done:  Once every three years after age 82 if you are at a normal weight and have a low risk for diabetes.  More often and at a younger age if you are overweight or have a high risk for diabetes. What should I know about preventing infection? Hepatitis B If you have a higher risk for hepatitis B, you should be screened for this virus. Talk with your health care provider to find out if you are at risk for hepatitis B infection. Hepatitis C Blood testing is recommended for:  Everyone born from [birth year] through 1966.  Anyone with known risk factors for hepatitis C. Sexually transmitted infections (STIs)  You should be screened each year for STIs, including gonorrhea and chlamydia, if: ? You are sexually active and are younger than 31 years of age. ? You are older than 31 years of age and your health care provider tells you that you are at risk for this type of infection. ? Your sexual activity has changed since you were last screened, and you are at increased risk for chlamydia or gonorrhea. Ask your health care provider if you are at risk.  Ask your health care provider about whether you are at high risk for HIV. Your health care provider may recommend a prescription medicine to help prevent HIV infection. If you choose to take medicine to prevent HIV, you should first get tested for HIV. You should then be tested every 3 months for as long as you are taking the medicine. Follow these instructions at home: Lifestyle  Do not use any products that contain nicotine or tobacco, such as cigarettes, e-cigarettes, and chewing tobacco. If you need help quitting, ask your health care provider.  Do not use street drugs.  Do not share needles.  Ask your health care provider  for help if you need support or information about quitting drugs. Alcohol use  Do not drink alcohol if your health  care provider tells you not to drink.  If you drink alcohol: ? Limit how much you have to 0-2 drinks a day. ? Be aware of how much alcohol is in your drink. In the [place] one drink equals one 12 oz bottle of beer (355 mL), one 5 oz glass of wine (148 mL), or one 1 oz glass of hard liquor (44 mL). General instructions  Schedule regular health, dental, and eye exams.  Stay current with your vaccines.  Tell your health care provider if: ? You often feel depressed. ? You have ever been abused or do not feel safe at home. Summary  Adopting a healthy lifestyle and getting preventive care are important in promoting health and wellness.  Follow your health care provider's instructions about healthy diet, exercising, and getting tested or screened for diseases.  Follow your health care provider's instructions on monitoring your cholesterol and blood pressure. This information is not intended to replace advice given to you by your health care provider. Make sure you discuss any questions you have with your health care provider. Document Revised: 08/01/2018 Document Reviewed: 08/01/2018 Elsevier Patient Education  2021 [employer].     Edwina Barth, MD [facility]

## 2020-12-29 NOTE — Patient Instructions (Signed)

## 2020-12-29 NOTE — Assessment & Plan Note (Signed)
We will measure testosterone levels today and refer to urology for further evaluation.

## 2021-01-06 LAB — TESTT+TESTF+SHBG
Sex Hormone Binding: 23.2 nmol/L (ref 16.5–55.9)
Testosterone, Free: 14 pg/mL (ref 8.7–25.1)
Testosterone, Total, LC/MS: 389.7 ng/dL (ref 264.0–916.0)

## 2021-01-07 ENCOUNTER — Other Ambulatory Visit: Payer: Self-pay | Admitting: Emergency Medicine

## 2021-01-07 DIAGNOSIS — E039 Hypothyroidism, unspecified: Secondary | ICD-10-CM

## 2021-01-07 MED ORDER — LEVOTHYROXINE SODIUM 50 MCG PO TABS: 50.0000 ug | ORAL_TABLET | Freq: Every day | ORAL | 3 refills | Status: DC

## 2021-02-10 ENCOUNTER — Ambulatory Visit: Payer: BC Managed Care – PPO | Admitting: Emergency Medicine

## 2021-02-10 DIAGNOSIS — Z0289 Encounter for other administrative examinations: Secondary | ICD-10-CM

## 2021-04-07 DIAGNOSIS — Z20822 Contact with and (suspected) exposure to covid-19: Secondary | ICD-10-CM | POA: Diagnosis not present

## 2021-07-13 ENCOUNTER — Encounter: Payer: Self-pay | Admitting: Emergency Medicine

## 2021-07-13 ENCOUNTER — Ambulatory Visit (INDEPENDENT_AMBULATORY_CARE_PROVIDER_SITE_OTHER): Payer: BC Managed Care – PPO

## 2021-07-13 ENCOUNTER — Ambulatory Visit (INDEPENDENT_AMBULATORY_CARE_PROVIDER_SITE_OTHER): Payer: BC Managed Care – PPO | Admitting: Sports Medicine

## 2021-07-13 ENCOUNTER — Other Ambulatory Visit: Payer: Self-pay

## 2021-07-13 ENCOUNTER — Ambulatory Visit (INDEPENDENT_AMBULATORY_CARE_PROVIDER_SITE_OTHER): Payer: BC Managed Care – PPO | Admitting: Emergency Medicine

## 2021-07-13 ENCOUNTER — Ambulatory Visit: Payer: Self-pay

## 2021-07-13 VITALS — BP 132/82 | HR 78 | Ht 74.0 in | Wt 278.0 lb

## 2021-07-13 DIAGNOSIS — M25311 Other instability, right shoulder: Secondary | ICD-10-CM

## 2021-07-13 DIAGNOSIS — Z889 Allergy status to unspecified drugs, medicaments and biological substances status: Secondary | ICD-10-CM | POA: Diagnosis not present

## 2021-07-13 DIAGNOSIS — G8929 Other chronic pain: Secondary | ICD-10-CM

## 2021-07-13 DIAGNOSIS — F32A Depression, unspecified: Secondary | ICD-10-CM | POA: Diagnosis not present

## 2021-07-13 DIAGNOSIS — E039 Hypothyroidism, unspecified: Secondary | ICD-10-CM | POA: Insufficient documentation

## 2021-07-13 DIAGNOSIS — M25511 Pain in right shoulder: Secondary | ICD-10-CM | POA: Diagnosis not present

## 2021-07-13 MED ORDER — MELOXICAM 15 MG PO TABS: 15.0000 mg | ORAL_TABLET | Freq: Every day | ORAL | 0 refills | Status: DC

## 2021-07-13 NOTE — Assessment & Plan Note (Signed)
Stable.  Still awaiting psychiatry referral response.

## 2021-07-13 NOTE — Progress Notes (Signed)
Brian Webster 30 y.o.   Chief Complaint  Patient presents with   Follow-up    Pt states he hs shoulder pain, right side, x 6 weeks.    HISTORY OF PRESENT ILLNESS: This is a 30 y.o. male complaining of pain to right shoulder for the last 6 to 7 weeks.  Possible injury at the gym. No other associated symptoms. Last office visit with me last May was concerned about infertility issues.  Wife and patient expecting a kid. Has history of chronic depression.  Was referred to psychiatry but unable to schedule visit yet.  Stable. Blood work back then showed increased TSH.  Was started on Synthroid 50 mcg daily.  Doing well. No other complaints or medical concerns today.  HPI   Prior to Admission medications   Medication Sig Start Date End Date Taking? Authorizing Provider  levothyroxine (SYNTHROID) 50 MCG tablet Take 1 tablet (50 mcg total) by mouth daily. 01/07/21  Yes Dezerae Freiberger, Eilleen Kempf, MD  Multiple Vitamin (MULTIVITAMIN WITH MINERALS) TABS tablet Take 1 tablet by mouth daily.   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  Past Surgical History:  Procedure Laterality Date   TONSILECTOMY, ADENOIDECTOMY, BILATERAL MYRINGOTOMY AND TUBES      Social History   Socioeconomic History   Marital status: Single    Spouse name: Not on file   Number of children: Not on file   Years of education: Not on file   Highest education level: Not on file  Occupational History   Not on file  Tobacco Use   Smoking status: Never   Smokeless tobacco: Not on file  Substance and Sexual Activity   Alcohol use: Yes   Drug use: No   Sexual activity: Yes  Other Topics Concern   Not on file  Social History Narrative   Not on file   Social Determinants of Health   Financial Resource Strain: Not on file  Food Insecurity: Not on  file  Transportation Needs: Not on file  Physical Activity: Not on file  Stress: Not on file  Social Connections: Not on file  Intimate Partner Violence: Not on file    Family History  Problem Relation Age of Onset   Heart disease Mother    Hypertension Mother      Review of Systems  Constitutional: Negative.  Negative for chills and fever.  HENT: Negative.  Negative for congestion and sore throat.   Respiratory: Negative.  Negative for cough and shortness of breath.   Cardiovascular: Negative.  Negative for chest pain and palpitations.  Gastrointestinal: Negative.  Negative for abdominal pain, diarrhea, nausea and vomiting.  Genitourinary: Negative.   Musculoskeletal:  Positive for joint pain (Right shoulder).  Skin: Negative.  Negative for rash.  Neurological:  Negative for dizziness and headaches.  Psychiatric/Behavioral:  Positive for depression (Chronic).   All other systems reviewed and are negative.  Today's Vitals   07/13/21 0815  BP: 132/82  Pulse: 78  SpO2: 98%  Weight: 278 lb (126.1 kg)  Height: 6\' 2"  (1.88 m)   Body mass index is 35.69 kg/m.  Physical Exam Vitals reviewed.  Constitutional:      Appearance: Normal appearance.  HENT:     Head: Normocephalic.  Eyes:     Extraocular Movements: Extraocular movements intact.  Pupils: Pupils are equal, round, and reactive to light.  Cardiovascular:     Rate and Rhythm: Normal rate and regular rhythm.     Pulses: Normal pulses.     Heart sounds: Normal heart sounds.  Pulmonary:     Effort: Pulmonary effort is normal.     Breath sounds: Normal breath sounds.  Musculoskeletal:     Cervical back: Normal range of motion and neck supple.     Comments: Right shoulder: No tenderness or swelling.  Full range of motion but complaining of some pain.  Skin:    General: Skin is warm and dry.     Capillary Refill: Capillary refill takes less than 2 seconds.  Neurological:     General: No focal deficit present.      Mental Status: He is alert and oriented to person, place, and time.  Psychiatric:        Mood and Affect: Mood normal.        Behavior: Behavior normal.     ASSESSMENT & PLAN: Problem List Items Addressed This Visit       Endocrine   Hypothyroidism    Clinically euthyroid.  Continue Synthroid 50 mcg daily.        Other   Chronic depression    Stable.  Still awaiting psychiatry referral response.      Chronic right shoulder pain - Primary    May have rotator cuff injury.  X-ray today. Sports medicine evaluation.  Referral placed today.      Relevant Orders   DG Shoulder Right   Ambulatory referral to Sports Medicine   History of multiple allergies    Referral to allergist placed today.      Relevant Orders   Ambulatory referral to Allergy   Patient Instructions  Health Maintenance, Male Adopting a healthy lifestyle and getting preventive care are important in promoting health and wellness. Ask your health care provider about: The right schedule for you to have regular tests and exams. Things you can do on your own to prevent diseases and keep yourself healthy. What should I know about diet, weight, and exercise? Eat a healthy diet  Eat a diet that includes plenty of vegetables, fruits, low-fat dairy products, and lean protein. Do not eat a lot of foods that are high in solid fats, added sugars, or sodium. Maintain a healthy weight Body mass index (BMI) is a measurement that can be used to identify possible weight problems. It estimates body fat based on height and weight. Your health care provider can help determine your BMI and help you achieve or maintain a healthy weight. Get regular exercise Get regular exercise. This is one of the most important things you can do for your health. Most adults should: Exercise for at least 150 minutes each week. The exercise should increase your heart rate and make you sweat (moderate-intensity exercise). Do strengthening  exercises at least twice a week. This is in addition to the moderate-intensity exercise. Spend less time sitting. Even light physical activity can be beneficial. Watch cholesterol and blood lipids Have your blood tested for lipids and cholesterol at 31 years of age, then have this test every 5 years. You may need to have your cholesterol levels checked more often if: Your lipid or cholesterol levels are high. You are older than 31 years of age. You are at high risk for heart disease. What should I know about cancer screening? Many types of cancers can be detected early and may often be  prevented. Depending on your health history and family history, you may need to have cancer screening at various ages. This may include screening for: Colorectal cancer. Prostate cancer. Skin cancer. Lung cancer. What should I know about heart disease, diabetes, and high blood pressure? Blood pressure and heart disease High blood pressure causes heart disease and increases the risk of stroke. This is more likely to develop in people who have high blood pressure readings or are overweight. Talk with your health care provider about your target blood pressure readings. Have your blood pressure checked: Every 3-5 years if you are 29-47 years of age. Every year if you are 35 years old or older. If you are between the ages of 21 and 83 and are a current or former smoker, ask your health care provider if you should have a one-time screening for abdominal aortic aneurysm (AAA). Diabetes Have regular diabetes screenings. This checks your fasting blood sugar level. Have the screening done: Once every three years after age 28 if you are at a normal weight and have a low risk for diabetes. More often and at a younger age if you are overweight or have a high risk for diabetes. What should I know about preventing infection? Hepatitis B If you have a higher risk for hepatitis B, you should be screened for this virus. Talk  with your health care provider to find out if you are at risk for hepatitis B infection. Hepatitis C Blood testing is recommended for: Everyone born from [birth year] through 1966. Anyone with known risk factors for hepatitis C. Sexually transmitted infections (STIs) You should be screened each year for STIs, including gonorrhea and chlamydia, if: You are sexually active and are younger than 31 years of age. You are older than 31 years of age and your health care provider tells you that you are at risk for this type of infection. Your sexual activity has changed since you were last screened, and you are at increased risk for chlamydia or gonorrhea. Ask your health care provider if you are at risk. Ask your health care provider about whether you are at high risk for HIV. Your health care provider may recommend a prescription medicine to help prevent HIV infection. If you choose to take medicine to prevent HIV, you should first get tested for HIV. You should then be tested every 3 months for as long as you are taking the medicine. Follow these instructions at home: Alcohol use Do not drink alcohol if your health care provider tells you not to drink. If you drink alcohol: Limit how much you have to 0-2 drinks a day. Know how much alcohol is in your drink. In the [place] one drink equals one 12 oz bottle of beer (355 mL), one 5 oz glass of wine (148 mL), or one 1 oz glass of hard liquor (44 mL). Lifestyle Do not use any products that contain nicotine or tobacco. These products include cigarettes, chewing tobacco, and vaping devices, such as e-cigarettes. If you need help quitting, ask your health care provider. Do not use street drugs. Do not share needles. Ask your health care provider for help if you need support or information about quitting drugs. General instructions Schedule regular health, dental, and eye exams. Stay current with your vaccines. Tell your health care provider if: You often feel  depressed. You have ever been abused or do not feel safe at home. Summary Adopting a healthy lifestyle and getting preventive care are important in promoting health and wellness.  Follow your health care provider's instructions about healthy diet, exercising, and getting tested or screened for diseases. Follow your health care provider's instructions on monitoring your cholesterol and blood pressure. This information is not intended to replace advice given to you by your health care provider. Make sure you discuss any questions you have with your health care provider. Document Revised: 12/28/2020 Document Reviewed: 12/28/2020 Elsevier Patient Education  2022 Elsevier [employer].    Edwina Barth, MD [facility]

## 2021-07-13 NOTE — Assessment & Plan Note (Signed)
Referral to allergist placed today.

## 2021-07-13 NOTE — Patient Instructions (Addendum)
Good to see you Physical therapy referral placed  Meloxicam 15mg  daily for 2-3 weeks  Home exercises given shoulder rotator cuff Follow up 3-4 weeks

## 2021-07-13 NOTE — Assessment & Plan Note (Signed)
Clinically euthyroid. Continue Synthroid 50 mcg daily. 

## 2021-07-13 NOTE — Progress Notes (Signed)
[name] [name] [address] [place] [address] Phone: [phone]   Assessment and Plan:     1. Acute pain of right shoulder 2. 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  Differential includes biceps tendinopathy, supraspinatus tendinopathy, labral pathology.  It is possible that symptoms are worsened from history of injury to upper thoracic spine, however no radicular symptoms on physical exam, no red flag symptoms, so no spinal imaging performed today - Start meloxicam 15 mg daily x2 to 3 weeks and then may use remainder as needed - Start physical therapy and HEP for rotator cuff and shoulder - Avoid weight lifting and overhead activities that stretch shoulders - X-ray obtained in clinic.  My interpretation: No acute fracture, no dislocation, no cortical changes.  Decreased space at [facility] joint without significant cortical changes  Sports Medicine: Musculoskeletal Ultrasound.   Exam:Right Complete Shoulder Exam.  Diagnosis: Right shoulder pain  Biceps tendon: Abnormal- mild hypoechoic edema surrounding superior biceps tendon in the groove without clear tearing Subscapularis: Normal Supraspinatus: Abnormal- chronic appearing hyperechoic changes near insertion point Infraspinatus/teres minor: Normal Glenohumeral joint (posterior): Normal Acromioclavicular joint: Normal Additional findings: None    Impression:  Supraspinatus tendinopathy 2.  Possible biceps tendinitis  Pertinent previous records reviewed include PCP note from 07/13/2021   Follow Up: 3 to 4-week follow-up for reevaluation.  If no improvement or worsening of symptoms could consider CSI versus OMT.  If patient continues to have radicular symptoms or neck/back pain could obtain C-spine and T-spine x-rays.   Subjective:   I, [name], am serving as a  scribe for Dr. [name]  Chief Complaint: Right shoulder pain   HPI:   07/13/21 Patient is a 30 year old male presenting with R shoulder pain that has been going on for 6-7 weeks. Patient was seen by PCP a little bit ago and referred to our office for evaluation and treatment. Patient states he feels lots of numbness, weakness, and pain in right arm. Patient states he was doing 400 pound weighted squats when his neck and shoulder started to bother him and the pain has just continued on. Patient locates pain the front of shoulder that will be sharp and then radiate down his arm with numbness. Pain is worse when reaching across his body and if he falls asleep with his arm up. Patient has tried the heating pad and "roughing it out" and no mechanical symptoms.      Relevant Historical Information: History of multiple shoulder and back injuries playing football in high school.  Additional pertinent review of systems negative.   Current Outpatient Medications:    levothyroxine (SYNTHROID) 50 MCG tablet, Take 1 tablet (50 mcg total) by mouth daily., Disp: 90 tablet, Rfl: 3   meloxicam (MOBIC) 15 MG tablet, Take 1 tablet (15 mg total) by mouth daily., Disp: 30 tablet, Rfl: 0   Multiple Vitamin (MULTIVITAMIN WITH MINERALS) TABS tablet, Take 1 tablet by mouth daily., Disp: , Rfl:    Objective:     Vitals:   07/13/21 0908  BP: 132/82  Pulse: 78  SpO2: 98%  Weight: 278 lb (126.1 kg)  Height: 6\' 2"  (1.88 m)      Body mass index is 35.69 kg/m.    Physical Exam:    Gen: Appears well, nad, nontoxic and pleasant Neuro:sensation intact, strength is 5/5 with df/pf/inv/ev, muscle tone wnl Skin:  no suspicious lesion or defmority Psych: A&O, appropriate mood and affect  Shoulder: no deformity, swelling or muscle wasting No scapular winging FF 180, abd 180, int 0, ext 90 Mild TTP biceps tendon and groove NTTP over the [place], clavicle, ac, coracoid, humerus, deltoid, trapezius, cervical  spine Mildly positive empty can and O'Brien's Neg neer, hawkings, subscap liftoff, speeds, crossarm Neg ant drawer, sulcus sign, apprehension Negative Spurling's test bilat FROM of neck    Electronically signed by:  [name] [name] 9:58 AM 07/13/21

## 2021-07-13 NOTE — Assessment & Plan Note (Signed)
May have rotator cuff injury.  X-ray today. Sports medicine evaluation.  Referral placed today.

## 2021-07-13 NOTE — Patient Instructions (Signed)

## 2021-08-09 ENCOUNTER — Ambulatory Visit: Payer: BC Managed Care – PPO | Admitting: Sports Medicine

## 2021-08-09 NOTE — Progress Notes (Deleted)
° °   Brian Webster D.[facility] [address] [place] [address] Phone: [phone]   Assessment and Plan:     There are no diagnoses linked to this encounter.  ***   Pertinent previous records reviewed include ***   Follow Up: ***     Subjective:    I, Brian Webster, am serving as a Neurosurgeon for Brian Webster  Chief Complaint: R shoulder pain    HPI:   07/13/21 Patient is a 30 year old male presenting with R shoulder pain that has been going on for 6-7 weeks. Patient was seen by PCP a little bit ago and referred to our office for evaluation and treatment. Patient states he feels lots of numbness, weakness, and pain in right arm. Patient states he was doing 400 pound weighted squats when his neck and shoulder started to bother him and the pain has just continued on. Patient locates pain the front of shoulder that will be sharp and then radiate down his arm with numbness. Pain is worse when reaching across his body and if he falls asleep with his arm up. Patient has tried the heating pad and "roughing it out" and no mechanical symptoms.   08/09/2021 Patient states   Relevant Historical Information: ***  Additional pertinent review of systems negative.   Current Outpatient Medications:    levothyroxine (SYNTHROID) 50 MCG tablet, Take 1 tablet (50 mcg total) by mouth daily., Disp: 90 tablet, Rfl: 3   meloxicam (MOBIC) 15 MG tablet, Take 1 tablet (15 mg total) by mouth daily., Disp: 30 tablet, Rfl: 0   Multiple Vitamin (MULTIVITAMIN WITH MINERALS) TABS tablet, Take 1 tablet by mouth daily., Disp: , Rfl:    Objective:     There were no vitals filed for this visit.    There is no height or weight on file to calculate BMI.    Physical Exam:    ***   Electronically signed by:  Brian Webster D.[facility] 7:40 AM 08/09/21

## 2021-10-05 ENCOUNTER — Ambulatory Visit (INDEPENDENT_AMBULATORY_CARE_PROVIDER_SITE_OTHER): Payer: BC Managed Care – PPO | Admitting: Allergy & Immunology

## 2021-10-05 ENCOUNTER — Other Ambulatory Visit: Payer: Self-pay

## 2021-10-05 ENCOUNTER — Encounter: Payer: Self-pay | Admitting: Allergy & Immunology

## 2021-10-05 VITALS — BP 124/80 | HR 95 | Temp 97.7°F | Resp 12 | Ht 73.62 in | Wt 285.8 lb

## 2021-10-05 DIAGNOSIS — J302 Other seasonal allergic rhinitis: Secondary | ICD-10-CM

## 2021-10-05 DIAGNOSIS — T781XXA Other adverse food reactions, not elsewhere classified, initial encounter: Secondary | ICD-10-CM | POA: Diagnosis not present

## 2021-10-05 DIAGNOSIS — J3089 Other allergic rhinitis: Secondary | ICD-10-CM

## 2021-10-05 DIAGNOSIS — T781XXD Other adverse food reactions, not elsewhere classified, subsequent encounter: Secondary | ICD-10-CM

## 2021-10-05 DIAGNOSIS — R203 Hyperesthesia: Secondary | ICD-10-CM | POA: Insufficient documentation

## 2021-10-05 MED ORDER — MONTELUKAST SODIUM 10 MG PO TABS: 10.0000 mg | ORAL_TABLET | Freq: Every day | ORAL | 2 refills | Status: DC

## 2021-10-05 MED ORDER — CARBINOXAMINE MALEATE 4 MG PO TABS: 4.0000 mg | ORAL_TABLET | Freq: Three times a day (TID) | ORAL | 2 refills | Status: DC | PRN

## 2021-10-05 NOTE — Patient Instructions (Addendum)
1. Seasonal and perennial allergic rhinitis - Testing today showed: grasses, ragweed, weeds, trees, indoor molds, outdoor molds, dust mites, cat, dog, and cockroach. - Copy of test results provided.  - Avoidance measures provided. - Stop taking: Allegra - Start taking: Singulair (montelukast) 10mg  daily and carbinoxamine 4 mg every 8 hours as needed (CAN cause sleepiness at first) - You can use an extra dose of the antihistamine (carbinoxamine), if needed, for breakthrough symptoms.  - The Singulair can cause irritability and bad dreams, but this is a RARE side effect. - Call us if this happens and stop the medication. - We are avoiding nose sprays per your request.  - Consider allergy shots as a means of long-term control. - Allergy shots "re-train" and "reset" the immune system to ignore environmental allergens and decrease the resulting immune response to those allergens (sneezing, itchy watery eyes, runny nose, nasal congestion, etc).    - Allergy shots improve symptoms in 75-85% of patients.  - We can discuss more at the next appointment if the medications are not working for you.  2. Pollen-food allergy syndrome, subsequent encounter - Testing to cantaloupe and bananas was negative. - The oral allergy syndrome (OAS) or pollen-food allergy syndrome (PFAS) is a relatively common form of food allergy, particularly in adults.  - It typically occurs in people who have pollen allergies when the immune system "sees" proteins on the food that look like proteins on the pollen.  - This results in the allergy antibody (IgE) binding to the food instead of the pollen.  - Patients typically report itching and/or mild swelling of the mouth and throat immediately following ingestion of certain uncooked fruits (including nuts) or raw vegetables.  - Only a very small number of affected individuals experience systemic allergic reactions, such as anaphylaxis which occurs with true food allergies.        3. Sensitive skin - Avoid triggering chemicals/cosmetics. - Add on Opzelura twice daily to help with itchiness as needed to affect areas (sample provided). - I like this one because it can be used from head to toe (without the side effects of steroids)  4. Return in about 2 months (around 12/03/2021).    Please inform us of any Emergency Department visits, hospitalizations, or changes in symptoms. Call us before going to the ED for breathing or allergy symptoms since we might be able to fit you in for a sick visit. Feel free to contact us anytime with any questions, problems, or concerns.  It was a pleasure to meet you today!  Websites that have reliable patient information: 1. American Academy of Asthma, Allergy, and Immunology: www.aaaai.org 2. Food Allergy Research and Education (FARE): foodallergy.org 3. Mothers of Asthmatics: http://www.asthmacommunitynetwork.org 4. American College of Allergy, Asthma, and Immunology: www.acaai.org   COVID-19 Vaccine Information can be found at: [email or web address] For questions related to vaccine distribution or appointments, please email [email or web address] or call [phone].   We realize that you might be concerned about having an allergic reaction to the COVID19 vaccines. To help with that concern, WE ARE OFFERING THE COVID19 VACCINES IN OUR OFFICE! Ask the front desk for dates!     Like [place] on [employer] and Instagram for our latest updates!      A healthy democracy works best when [employer] participate! Make sure you are registered to vote! If you have moved or changed any of your contact information, you will need to get this updated before voting!  In some cases, you MAY be able to  register to vote online: [email or web address]       Airborne Adult Perc - 10/05/21 1000     Time Antigen Placed 1009    Allergen Manufacturer Waynette ButteryGreer     Location Back    Number of Test 59    1. Control-Buffer 50% Glycerol Negative    2. Control-Histamine 1 mg/ml 2+    3. Albumin saline Negative    4. [place] 4+    5. [place] 4+    6. [place] 4+    7. [place] Blue 4+    8. Meadow Fescue 4+    9. Perennial Rye 4+    10. Sweet Vernal 4+    11. Timothy 3+    12. Cocklebur 2+    13. Burweed Marshelder 3+    14. Ragweed, short 3+    15. Ragweed, Giant 3+    16. Plantain,  English 2+    17. Lamb's Quarters 3+    18. Sheep Sorrell 3+    19. Rough Pigweed 4+    20. Marsh Elder, Rough 2+    21. Mugwort, Common 3+    22. Ash mix 2+    23. Birch mix Negative    24. Beech American Negative    25. Box, Elder Negative    26. Cedar, red Negative    27. Cottonwood, Eastern 2+    28. Elm mix 3+    29. Hickory 3+    30. Maple mix 3+    31. Oak, [place] mix 3+    32. Pecan Pollen 2+    33. Pine mix 2+    34. Sycamore Eastern 3+    35. Walnut, Black Pollen 3+    36. Alternaria alternata Negative    37. Cladosporium Herbarum Negative    38. Aspergillus mix Negative    39. Penicillium mix Negative    40. Bipolaris sorokiniana (Helminthosporium) Negative    41. Drechslera spicifera (Curvularia) Negative    42. Mucor plumbeus Negative    43. Fusarium moniliforme Negative    44. Aureobasidium pullulans (pullulara) Negative    45. Rhizopus oryzae Negative    46. Botrytis cinera Negative    47. Epicoccum nigrum Negative    48. Phoma betae Negative    49. Candida Albicans Negative    50. Trichophyton mentagrophytes Negative    51. Mite, D Farinae  5,000 AU/ml 3+    52. Mite, D Pteronyssinus  5,000 AU/ml 2+    53. Cat Hair 10,000 BAU/ml 3+    54.  Dog Epithelia 2+    55. Mixed Feathers Negative    56. Horse Epithelia Negative    57. Cockroach, German Negative    58. Mouse Negative    59. Tobacco Leaf Negative             Intradermal - 10/05/21 1050     Time Antigen Placed 1100    Allergen Manufacturer Greer    Location Arm     Number of Test 6    Control Negative    Mold 1 2+    Mold 2 3+    Mold 3 2+    Mold 4 3+    Cockroach 3+             Food Adult Perc - 10/05/21 1000     Time Antigen Placed 1010    Allergen Manufacturer Greer    Location Back    Number of allergen test 2    57. Banana Negative  74. Cantaloupe Negative              Reducing Pollen Exposure  The American Academy of Allergy, Asthma and Immunology suggests the following steps to reduce your exposure to pollen during allergy seasons.    Do not hang sheets or clothing out to dry; pollen may collect on these items. Do not mow lawns or spend time around freshly cut grass; mowing stirs up pollen. Keep windows closed at night.  Keep car windows closed while driving. Minimize morning activities outdoors, a time when pollen counts are usually at their highest. Stay indoors as much as possible when pollen counts or humidity is high and on windy days when pollen tends to remain in the air longer. Use air conditioning when possible.  Many air conditioners have filters that trap the pollen spores. Use a HEPA room air filter to remove pollen form the indoor air you breathe.  Control of Mold Allergen   Mold and fungi can grow on a variety of surfaces provided certain temperature and moisture conditions exist.  Outdoor molds grow on plants, decaying vegetation and soil.  The major outdoor mold, Alternaria and Cladosporium, are found in very high numbers during hot and dry conditions.  Generally, a late Summer - Fall peak is seen for common outdoor fungal spores.  Rain will temporarily lower outdoor mold spore count, but counts rise rapidly when the rainy period ends.  The most important indoor molds are Aspergillus and Penicillium.  Dark, humid and poorly ventilated basements are ideal sites for mold growth.  The next most common sites of mold growth are the bathroom and the kitchen.  Outdoor (Seasonal) Mold Control  Positive outdoor  molds via skin testing: Alternaria, Cladosporium, Bipolaris (Helminthsporium), Drechslera (Curvalaria), and Mucor  Use air conditioning and keep windows closed Avoid exposure to decaying vegetation. Avoid leaf raking. Avoid grain handling. Consider wearing a face mask if working in moldy areas.    Indoor (Perennial) Mold Control   Positive indoor molds via skin testing: Aspergillus, Penicillium, Fusarium, Aureobasidium (Pullulara), and Rhizopus  Maintain humidity below 50%. Clean washable surfaces with 5% bleach solution. Remove sources e.g. contaminated carpets.    Control of Dust Mite Allergen    Dust mites play a major role in allergic asthma and rhinitis.  They occur in environments with high humidity wherever human skin is found.  Dust mites absorb humidity from the atmosphere (ie, they do not drink) and feed on organic matter (including shed human and animal skin).  Dust mites are a microscopic type of insect that you cannot see with the naked eye.  High levels of dust mites have been detected from mattresses, pillows, carpets, upholstered furniture, bed covers, clothes, soft toys and any woven material.  The principal allergen of the dust mite is found in its feces.  A gram of dust may contain 1,000 mites and 250,000 fecal particles.  Mite antigen is easily measured in the air during house cleaning activities.  Dust mites do not bite and do not cause harm to humans, other than by triggering allergies/asthma.    Ways to decrease your exposure to dust mites in your home:  Encase mattresses, box springs and pillows with a mite-impermeable barrier or cover   Wash sheets, blankets and drapes weekly in hot water (130 F) with detergent and dry them in a dryer on the hot setting.  Have the room cleaned frequently with a vacuum cleaner and a damp dust-mop.  For carpeting or rugs, vacuuming with  a vacuum cleaner equipped with a high-efficiency particulate air (HEPA) filter.  The dust mite  allergic individual should not be in a room which is being cleaned and should wait 1 hour after cleaning before going into the room. Do not sleep on upholstered furniture (eg, couches).   If possible removing carpeting, upholstered furniture and drapery from the home is ideal.  Horizontal blinds should be eliminated in the rooms where the person spends the most time (bedroom, study, television room).  Washable vinyl, roller-type shades are optimal. Remove all non-washable stuffed toys from the bedroom.  Wash stuffed toys weekly like sheets and blankets above.   Reduce indoor humidity to less than 50%.  Inexpensive humidity monitors can be purchased at most hardware stores.  Do not use a humidifier as can make the problem worse and are not recommended.  Control of Dog or Cat Allergen  Avoidance is the best way to manage a dog or cat allergy. If you have a dog or cat and are allergic to dog or cats, consider removing the dog or cat from the home. If you have a dog or cat but dont want to find it a new home, or if your family wants a pet even though someone in the household is allergic, here are some strategies that may help keep symptoms at bay:  Keep the pet out of your bedroom and restrict it to only a few rooms. Be advised that keeping the dog or cat in only one room will not limit the allergens to that room. Dont pet, hug or kiss the dog or cat; if you do, wash your hands with soap and water. High-efficiency particulate air (HEPA) cleaners run continuously in a bedroom or living room can reduce allergen levels over time. Regular use of a high-efficiency vacuum cleaner or a central vacuum can reduce allergen levels. Giving your dog or cat a bath at least once a week can reduce airborne allergen.  Control of Cockroach Allergen  Cockroach allergen has been identified as an important cause of acute attacks of asthma, especially in urban settings.  There are fifty-five species of cockroach that  exist in the [place], however only three, the [place], [place] species produce allergen that can affect patients with Asthma.  Allergens can be obtained from fecal particles, egg casings and secretions from cockroaches.    Remove food sources. Reduce access to water. Seal access and entry points. Spray runways with 0.5-1% Diazinon or Chlorpyrifos Blow boric acid power under stoves and refrigerator. Place bait stations (hydramethylnon) at feeding sites.  Allergy Shots   Allergies are the result of a chain reaction that starts in the immune system. Your immune system controls how your body defends itself. For instance, if you have an allergy to pollen, your immune system identifies pollen as an invader or allergen. Your immune system overreacts by producing antibodies called Immunoglobulin E (IgE). These antibodies travel to cells that release chemicals, causing an allergic reaction.  The concept behind allergy immunotherapy, whether it is received in the form of shots or tablets, is that the immune system can be desensitized to specific allergens that trigger allergy symptoms. Although it requires time and patience, the payback can be long-term relief.  How Do Allergy Shots Work?  Allergy shots work much like a vaccine. Your body responds to injected amounts of a particular allergen given in increasing doses, eventually developing a resistance and tolerance to it. Allergy shots can lead to decreased, minimal or no allergy  symptoms.  There generally are two phases: build-up and maintenance. Build-up often ranges from three to six months and involves receiving injections with increasing amounts of the allergens. The shots are typically given once or twice a week, though more rapid build-up schedules are sometimes used.  The maintenance phase begins when the most effective dose is reached. This dose is different for each person, depending on how allergic you are and your response  to the build-up injections. Once the maintenance dose is reached, there are longer periods between injections, typically two to four weeks.  Occasionally doctors give cortisone-type shots that can temporarily reduce allergy symptoms. These types of shots are different and should not be confused with allergy immunotherapy shots.  Who Can Be Treated with Allergy Shots?  Allergy shots may be a good treatment approach for people with allergic rhinitis (hay fever), allergic asthma, conjunctivitis (eye allergy) or stinging insect allergy.   Before deciding to begin allergy shots, you should consider:   The length of allergy season and the severity of your symptoms  Whether medications and/or changes to your environment can control your symptoms  Your desire to avoid long-term medication use  Time: allergy immunotherapy requires a major time commitment  Cost: may vary depending on your insurance coverage  Allergy shots for children age 80 and older are effective and often well tolerated. They might prevent the onset of new allergen sensitivities or the progression to asthma.  Allergy shots are not started on patients who are pregnant but can be continued on patients who become pregnant while receiving them. In some patients with other medical conditions or who take certain common medications, allergy shots may be of risk. It is important to mention other medications you talk to your allergist.   When Will I Feel Better?  Some may experience decreased allergy symptoms during the build-up phase. For others, it may take as long as 12 months on the maintenance dose. If there is no improvement after a year of maintenance, your allergist will discuss other treatment options with you.  If you arent responding to allergy shots, it may be because there is not enough dose of the allergen in your vaccine or there are missing allergens that were not identified during your allergy testing. Other reasons could  be that there are high levels of the allergen in your environment or major exposure to non-allergic triggers like tobacco smoke.  What Is the Length of Treatment?  Once the maintenance dose is reached, allergy shots are generally continued for three to five years. The decision to stop should be discussed with your allergist at that time. Some people may experience a permanent reduction of allergy symptoms. Others may relapse and a longer course of allergy shots can be considered.  What Are the Possible Reactions?  The two types of adverse reactions that can occur with allergy shots are local and systemic. Common local reactions include very mild redness and swelling at the injection site, which can happen immediately or several hours after. A systemic reaction, which is less common, affects the entire body or a particular body system. They are usually mild and typically respond quickly to medications. Signs include increased allergy symptoms such as sneezing, a stuffy nose or hives.  Rarely, a serious systemic reaction called anaphylaxis can develop. Symptoms include swelling in the throat, wheezing, a feeling of tightness in the chest, nausea or dizziness. Most serious systemic reactions develop within 30 minutes of allergy shots. This is why it is strongly  recommended you wait in your doctors office for 30 minutes after your injections. Your allergist is trained to watch for reactions, and his or her staff is trained and equipped with the proper medications to identify and treat them.  Who Should Administer Allergy Shots?  The preferred location for receiving shots is your prescribing allergists office. Injections can sometimes be given at another facility where the physician and staff are trained to recognize and treat reactions, and have received instructions by your prescribing allergist.

## 2021-10-05 NOTE — Progress Notes (Signed)
NEW PATIENT  Date of Service/Encounter:  10/05/21  Consult requested by: Horald Pollen, MD   Assessment:   Seasonal and perennial allergic rhinitis (grasses, ragweed, weeds, trees, indoor molds, outdoor molds, dust mites, cat, dog, and cockroach)  Pollen-food allergy syndrome  Sensitive skin  Plan/Recommendations:   1. Seasonal and perennial allergic rhinitis - Testing today showed: grasses, ragweed, weeds, trees, indoor molds, outdoor molds, dust mites, cat, dog, and cockroach. - Copy of test results provided.  - Avoidance measures provided. - Stop taking: Allegra - Start taking: Singulair (montelukast) 10mg  daily and carbinoxamine 4 mg every 8 hours as needed (CAN cause sleepiness at first) - You can use an extra dose of the antihistamine (carbinoxamine), if needed, for breakthrough symptoms.  - The Singulair can cause irritability and bad dreams, but this is a RARE side effect. - Call us if this happens and stop the medication. - We are avoiding nose sprays per your request.  - Consider allergy shots as a means of long-term control. - Allergy shots "re-train" and "reset" the immune system to ignore environmental allergens and decrease the resulting immune response to those allergens (sneezing, itchy watery eyes, runny nose, nasal congestion, etc).    - Allergy shots improve symptoms in 75-85% of patients.  - We can discuss more at the next appointment if the medications are not working for you.  2. Pollen-food allergy syndrome, subsequent encounter - Testing to cantaloupe and bananas was negative. - The oral allergy syndrome (OAS) or pollen-food allergy syndrome (PFAS) is a relatively common form of food allergy, particularly in adults.  - It typically occurs in people who have pollen allergies when the immune system "sees" proteins on the food that look like proteins on the pollen.  - This results in the allergy antibody (IgE) binding to the food instead of the  pollen.  - Patients typically report itching and/or mild swelling of the mouth and throat immediately following ingestion of certain uncooked fruits (including nuts) or raw vegetables.  - Only a very small number of affected individuals experience systemic allergic reactions, such as anaphylaxis which occurs with true food allergies.    3. Sensitive skin - Avoid triggering chemicals/cosmetics. - Add on Opzelura twice daily to help with itchiness as needed to affect areas (sample provided). - I like this one because it can be used from head to toe (without the side effects of steroids)  4. Return in about 2 months (around 12/03/2021).     This note in its entirety was forwarded to the Provider who requested this consultation.  Subjective:   Brian Webster is a 31 y.o. male presenting today for evaluation of  Chief Complaint  Patient presents with   Allergy Testing    Allergic to smoke causes asthma like symptoms  Bananas, honey dew and cantaloupe make his throat and lips itch   Allergic Rhinitis     Sneezing 20 to 30 times in 20 minutes, itching, nose bleeds. Spring thru Fall. Will get rashes under the arms if he doesn't use the specific type of deodorant.      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  Robbert Schoepp was referred by Horald Pollen, MD.  Sutter is a 31 y.o. male presenting for an evaluation of allergies . His mother named him Jaisean for "personal reasons" and told him to "live up to his name". He was born in [place] and then moved here around age 17 or 8 and then went up and down the [place] [facility] since that time. He has been for her 15 years.    Asthma/Respiratory  Symptom History: He was told that he had asthma when he was younger. This was due to his mother smoking cigarettes. Symptoms improved since moving out at age 65. He does not have an up to date inhaler at all since age 40 or so.   Allergic Rhinitis Symptom History: He does have itchy eyes, watery eyes and sneezing. He gets a sinus infection every spring multiple times. It just depends on what he is exposed to. He has a Netti pot that he uses to rinse his nose out. He has Flonase which seems to make it worse. He also tried some Nasacort but it made him sneeze more. He takes Human resources officer daily which he started around one year ago. It has slightly improved his symptoms, but not by much. He has done Sudafed but this just makes him feel tired.    Food Allergy Symptom History: He reacts to banana, honeydew and cantaloupe. It causes thorat to itch, nothing too severe.  He never had the need for an EpiPen. His worse reaction is to animal dander and cigarette smoking. Everything else is subtle.   Skin Symptom History: He does have sensitive skin to just about anything. He does deodorant since natural deodorant does not do the job. He reports breaking out from the deodorants.   Otherwise, there is no history of other atopic diseases, including asthma, drug allergies, stinging insect allergies, urticaria, or contact dermatitis. There is no significant infectious history. Vaccinations are up to date.    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 If you have any questions, ask your nurse or doctor.          STOP taking these medications    meloxicam 15  MG tablet Commonly known as: MOBIC Stopped by: Valentina Shaggy, MD       TAKE these medications    ALLEGRA ALLERGY PO Take by mouth as needed.   Carbinoxamine Maleate 4 MG Tabs Take 1 tablet (4 mg total) by mouth every 8 (eight) hours as needed. Started by: Valentina Shaggy, MD   levothyroxine 50 MCG tablet Commonly known as: SYNTHROID Take 1 tablet (50 mcg total) by mouth daily.   montelukast 10 MG tablet Commonly known as: Singulair Take 1 tablet (10 mg total) by mouth at bedtime. Started by: Valentina Shaggy, MD   multivitamin with minerals Tabs tablet Take 1 tablet by mouth daily.        Birth History: non-contributory  Developmental History: non-contributory  Past Surgical History: Past Surgical History:  Procedure Laterality Date   TONSILECTOMY, ADENOIDECTOMY, BILATERAL MYRINGOTOMY AND TUBES       Family History: Family History  Problem Relation Age of Onset  Heart disease Mother    Hypertension Mother    Allergic rhinitis Brother    Allergic rhinitis Son      Social History: Dayshon lives at home in a house that is 32 years old. There is hardwood throughout the home and carpeting in the bedrooms. There is gas heating and central cooling. There are no animals inside or outside of the home. There are no dust mite coverings on the bedding. He currently works as an Surveyor, quantity.  He works for [employer].  He has done this for 3 years.  He is not exposed to fumes, chemicals, or dust.  He does use a HEPA filter in the home.  They do not live near an interstate or industrial area.   Review of Systems  Constitutional: Negative.  Negative for fever, malaise/fatigue and weight loss.  HENT:  Positive for congestion. Negative for ear discharge and ear pain.        Positive for sneezing.  Positive for postnasal drip.  Eyes:  Negative for pain, discharge and redness.  Respiratory:  Negative for cough, sputum production, shortness of breath and  wheezing.   Cardiovascular: Negative.  Negative for chest pain and palpitations.  Gastrointestinal:  Negative for abdominal pain, constipation, diarrhea, heartburn, nausea and vomiting.  Skin: Negative.  Negative for itching and rash.  Neurological:  Negative for dizziness and headaches.  Endo/Heme/Allergies:  Positive for environmental allergies. Does not bruise/bleed easily.      Objective:   Blood pressure 124/80, pulse 95, temperature 97.7 F (36.5 C), temperature source Temporal, resp. rate 12, height 6' 1.62" (1.87 m), weight 285 lb 12.8 oz (129.6 kg), SpO2 96 %. Body mass index is 37.07 kg/m.    Physical Exam Vitals reviewed.  Constitutional:      Appearance: He is well-developed.     Comments: Very pleasant.  HENT:     Head: Normocephalic and atraumatic.     Right Ear: Tympanic membrane, ear canal and external ear normal. No drainage, swelling or tenderness. Tympanic membrane is not injected, scarred, erythematous, retracted or bulging.     Left Ear: Tympanic membrane, ear canal and external ear normal. No drainage, swelling or tenderness. Tympanic membrane is not injected, scarred, erythematous, retracted or bulging.     Nose: No nasal deformity, septal deviation, mucosal edema or rhinorrhea.     Right Turbinates: Enlarged, swollen and pale.     Left Turbinates: Enlarged, swollen and pale.     Right Sinus: No maxillary sinus tenderness or frontal sinus tenderness.     Left Sinus: No maxillary sinus tenderness or frontal sinus tenderness.     Mouth/Throat:     Mouth: Mucous membranes are not pale and not dry.     Pharynx: Uvula midline.  Eyes:     General: Allergic shiner present.        Right eye: No discharge.        Left eye: No discharge.     Conjunctiva/sclera: Conjunctivae normal.     Right eye: Right conjunctiva is not injected. No chemosis.    Left eye: Left conjunctiva is not injected. No chemosis.    Pupils: Pupils are equal, round, and reactive to light.   Cardiovascular:     Rate and Rhythm: Normal rate and regular rhythm.     Heart sounds: Normal heart sounds.  Pulmonary:     Effort: Pulmonary effort is normal. No tachypnea, accessory muscle usage or respiratory distress.     Breath sounds: Normal breath sounds. No  wheezing, rhonchi or rales.     Comments: Moving air well in all lung fields.  No increased work of breathing. Chest:     Chest wall: No tenderness.  Abdominal:     Tenderness: There is no abdominal tenderness. There is no guarding or rebound.  Lymphadenopathy:     Head:     Right side of head: No submandibular, tonsillar or occipital adenopathy.     Left side of head: No submandibular, tonsillar or occipital adenopathy.     Cervical: No cervical adenopathy.  Skin:    General: Skin is warm.     Capillary Refill: Capillary refill takes less than 2 seconds.     Coloration: Skin is not pale.     Findings: No abrasion, erythema, petechiae or rash. Rash is not papular, urticarial or vesicular.     Comments: No eczematous or urticarial lesions noted.  Neurological:     Mental Status: He is alert.  Psychiatric:        Behavior: Behavior is cooperative.     Diagnostic studies:   Allergy Studies:     Airborne Adult Perc - 10/05/21 1000     Time Antigen Placed 1009    Allergen Manufacturer Waynette Buttery    Location Back    Number of Test 59    1. Control-Buffer 50% Glycerol Negative    2. Control-Histamine 1 mg/ml 2+    3. Albumin saline Negative    4. Bahia 4+    5. [place] 4+    6. Johnson 4+    7. Kentucky Blue 4+    8. Meadow Fescue 4+    9. Perennial Rye 4+    10. Sweet Vernal 4+    11. Timothy 3+    12. Cocklebur 2+    13. Burweed Marshelder 3+    14. Ragweed, short 3+    15. Ragweed, Giant 3+    16. Plantain,  English 2+    17. Lamb's Quarters 3+    18. Sheep Sorrell 3+    19. Rough Pigweed 4+    20. Marsh Elder, Rough 2+    21. Mugwort, Common 3+    22. Ash mix 2+    23. Birch mix Negative    24. Beech  American Negative    25. Box, Elder Negative    26. Cedar, red Negative    27. Cottonwood, Eastern 2+    28. Elm mix 3+    29. Hickory 3+    30. Maple mix 3+    31. Oak, Guinea[place]au mix 3+    32. Pecan Pollen 2+    33. Pine mix 2+    34. Sycamore Eastern 3+    35. Walnut, Black Pollen 3+    36. Alternaria alternata Negative    37. Cladosporium Herbarum Negative    38. Aspergillus mix Negative    39. Penicillium mix Negative    40. Bipolaris sorokiniana (Helminthosporium) Negative    41. Drechslera spicifera (Curvularia) Negative    42. Mucor plumbeus Negative    43. Fusarium moniliforme Negative    44. Aureobasidium pullulans (pullulara) Negative    45. Rhizopus oryzae Negative    46. Botrytis cinera Negative    47. Epicoccum nigrum Negative    48. Phoma betae Negative    49. Candida Albicans Negative    50. Trichophyton mentagrophytes Negative    51. Mite, D Farinae  5,000 AU/ml 3+    52. Mite, D Pteronyssinus  5,000 AU/ml 2+  53. Cat Hair 10,000 BAU/ml 3+    54.  Dog Epithelia 2+    55. Mixed Feathers Negative    56. Horse Epithelia Negative    57. Cockroach, German Negative    58. Mouse Negative    59. Tobacco Leaf Negative             Intradermal - 10/05/21 1050     Time Antigen Placed 1100    Allergen Manufacturer Greer    Location Arm    Number of Test 6    Control Negative    Mold 1 2+    Mold 2 3+    Mold 3 2+    Mold 4 3+    Cockroach 3+             Food Adult Perc - 10/05/21 1000     Time Antigen Placed 1010    Allergen Manufacturer Greer    Location Back    Number of allergen test 2    57. Banana Negative    61. Cantaloupe Negative             Allergy testing results were read and interpreted by myself, documented by clinical staff.         Salvatore Marvel, MD [facility] [place] [facility] [place]

## 2021-10-09 DIAGNOSIS — J0100 Acute maxillary sinusitis, unspecified: Secondary | ICD-10-CM | POA: Diagnosis not present

## 2021-12-09 ENCOUNTER — Ambulatory Visit (INDEPENDENT_AMBULATORY_CARE_PROVIDER_SITE_OTHER): Payer: BC Managed Care – PPO | Admitting: Allergy & Immunology

## 2021-12-09 ENCOUNTER — Encounter: Payer: Self-pay | Admitting: Allergy & Immunology

## 2021-12-09 VITALS — BP 128/74 | HR 75 | Temp 97.6°F | Resp 16 | Ht 74.0 in | Wt 281.8 lb

## 2021-12-09 DIAGNOSIS — R203 Hyperesthesia: Secondary | ICD-10-CM | POA: Diagnosis not present

## 2021-12-09 DIAGNOSIS — T781XXD Other adverse food reactions, not elsewhere classified, subsequent encounter: Secondary | ICD-10-CM

## 2021-12-09 DIAGNOSIS — J3089 Other allergic rhinitis: Secondary | ICD-10-CM

## 2021-12-09 DIAGNOSIS — J302 Other seasonal allergic rhinitis: Secondary | ICD-10-CM

## 2021-12-09 NOTE — Patient Instructions (Addendum)
1. Seasonal and perennial allergic rhinitis ?- Previous testing was positive to: grasses, ragweed, weeds, trees, indoor molds, outdoor molds, dust mites, cat, dog, and cockroach. ?- Stop taking: Singulair since you did not think that it helped ?- Continue taking: carbinoxamine 4 mg every 8 hours as needed (CAN cause sleepiness) ?- You can use an extra dose of the antihistamine (carbinoxamine), if needed, for breakthrough symptoms.  ?- We will start with allergy shots as a means of long-term control. ?- This is a time commitment, but by next spring season, you should be feeling much better!  ?- Check insurance for expected copays and make an appointment to start shots. ? ?2. Sensitive skin ?- Avoid triggering chemicals/cosmetics. ?- Continue with Opzelura twice daily to help with itchiness as needed to affect areas (sample provided). ?- I like this one because it can be used from head to toe (without the side effects of steroids) ? ?3. Return in about 6 months (around 06/10/2022).  ? ? ?Please inform us of any Emergency Department visits, hospitalizations, or changes in symptoms. Call us before going to the ED for breathing or allergy symptoms since we might be able to fit you in for a sick visit. Feel free to contact us anytime with any questions, problems, or concerns. ? ?It was a pleasure to see you today! ? ?Websites that have reliable patient information: ?1. American Academy of Asthma, Allergy, and Immunology: www.aaaai.org ?2. Food Allergy Research and Education (FARE): foodallergy.org ?3. Mothers of Asthmatics: http://www.asthmacommunitynetwork.org ?4. [employer] of Allergy, Asthma, and Immunology: MissingWeapons.ca ? ? ?COVID-19 Vaccine Information can be found at: PodExchange.nl For questions related to vaccine distribution or appointments, please email [email or web address] or call [phone].  ? ?We realize that you might be concerned  about having an allergic reaction to the COVID19 vaccines. To help with that concern, WE ARE OFFERING THE COVID19 VACCINES IN OUR OFFICE! Ask the front desk for dates!  ? ? ? ??Like? [place] on Facebook and Instagram for our latest updates!  ?  ? ? ?A healthy democracy works best when [employer] participate! Make sure you are registered to vote! If you have moved or changed any of your contact information, you will need to get this updated before voting! ? ?In some cases, you MAY be able to register to vote online: [email or web address] ? ? ? ? ? ? ?Allergy Shots  ? ?Allergies are the result of a chain reaction that starts in the immune system. Your immune system controls how your body defends itself. For instance, if you have an allergy to pollen, your immune system identifies pollen as an invader or allergen. Your immune system overreacts by producing antibodies called Immunoglobulin E (IgE). These antibodies travel to cells that release chemicals, causing an allergic reaction. ? ?The concept behind allergy immunotherapy, whether it is received in the form of shots or tablets, is that the immune system can be desensitized to specific allergens that trigger allergy symptoms. Although it requires time and patience, the payback can be long-term relief. ? ?How Do Allergy Shots Work? ? ?Allergy shots work much like a vaccine. Your body responds to injected amounts of a particular allergen given in increasing doses, eventually developing a resistance and tolerance to it. Allergy shots can lead to decreased, minimal or no allergy symptoms. ? ?There generally are two phases: build-up and maintenance. Build-up often ranges from three to six months and involves receiving injections with increasing amounts of the allergens. The shots are typically given once or twice  a week, though more rapid build-up schedules are sometimes used. ? ?The maintenance phase begins when the most effective dose is reached.  This dose is different for each person, depending on how allergic you are and your response to the build-up injections. Once the maintenance dose is reached, there are longer periods between injections, typically two to four weeks. ? ?Occasionally doctors give cortisone-type shots that can temporarily reduce allergy symptoms. These types of shots are different and should not be confused with allergy immunotherapy shots. ? ?Who Can Be Treated with Allergy Shots? ? ?Allergy shots may be a good treatment approach for people with allergic rhinitis (hay fever), allergic asthma, conjunctivitis (eye allergy) or stinging insect allergy.  ? ?Before deciding to begin allergy shots, you should consider: ? ? The length of allergy season and the severity of your symptoms ? Whether medications and/or changes to your environment can control your symptoms ? Your desire to avoid long-term medication use ? Time: allergy immunotherapy requires a major time commitment ? Cost: may vary depending on your insurance coverage ? ?Allergy shots for children age 74 and older are effective and often well tolerated. They might prevent the onset of new allergen sensitivities or the progression to asthma. ? ?Allergy shots are not started on patients who are pregnant but can be continued on patients who become pregnant while receiving them. In some patients with other medical conditions or who take certain common medications, allergy shots may be of risk. It is important to mention other medications you talk to your allergist.  ? ?When Will I Feel Better? ? ?Some may experience decreased allergy symptoms during the build-up phase. For others, it may take as long as 12 months on the maintenance dose. If there is no improvement after a year of maintenance, your allergist will discuss other treatment options with you. ? ?If you aren?t responding to allergy shots, it may be because there is not enough dose of the allergen in your vaccine or there are  missing allergens that were not identified during your allergy testing. Other reasons could be that there are high levels of the allergen in your environment or major exposure to non-allergic triggers like tobacco smoke. ? ?What Is the Length of Treatment? ? ?Once the maintenance dose is reached, allergy shots are generally continued for three to five years. The decision to stop should be discussed with your allergist at that time. Some people may experience a permanent reduction of allergy symptoms. Others may relapse and a longer course of allergy shots can be considered. ? ?What Are the Possible Reactions? ? ?The two types of adverse reactions that can occur with allergy shots are local and systemic. Common local reactions include very mild redness and swelling at the injection site, which can happen immediately or several hours after. A systemic reaction, which is less common, affects the entire body or a particular body system. They are usually mild and typically respond quickly to medications. Signs include increased allergy symptoms such as sneezing, a stuffy nose or hives. ? ?Rarely, a serious systemic reaction called anaphylaxis can develop. Symptoms include swelling in the throat, wheezing, a feeling of tightness in the chest, nausea or dizziness. Most serious systemic reactions develop within 30 minutes of allergy shots. This is why it is strongly recommended you wait in your doctor?s office for 30 minutes after your injections. Your allergist is trained to watch for reactions, and his or her staff is trained and equipped with the proper medications to identify  and treat them. ? ?Who Should Administer Allergy Shots? ? ?The preferred location for receiving shots is your prescribing allergist?s office. Injections can sometimes be given at another facility where the physician and staff are trained to recognize and treat reactions, and have received instructions by your prescribing allergist. ? ? ? ? ? ?

## 2021-12-09 NOTE — Progress Notes (Signed)
? ?FOLLOW UP ? ?Date of Service/Encounter:  12/09/21 ? ? ?Assessment:  ? ?Seasonal and perennial allergic rhinitis (grasses, ragweed, weeds, trees, indoor molds, outdoor molds, dust mites, cat, dog, and cockroach) ?  ?Pollen-food allergy syndrome ?  ?Sensitive skin ? ?Plan/Recommendations:  ? ?1. Seasonal and perennial allergic rhinitis ?- Previous testing was positive to: grasses, ragweed, weeds, trees, indoor molds, outdoor molds, dust mites, cat, dog, and cockroach. ?- Stop taking: Singulair since you did not think that it helped ?- Continue taking: carbinoxamine 4 mg every 8 hours as needed (CAN cause sleepiness) ?- You can use an extra dose of the antihistamine (carbinoxamine), if needed, for breakthrough symptoms.  ?- We will start with allergy shots as a means of long-term control. ?- This is a time commitment, but by next spring season, you should be feeling much better!  ?- Check insurance for expected copays and make an appointment to start shots. ? ?2. Sensitive skin ?- Avoid triggering chemicals/cosmetics. ?- Continue with Opzelura twice daily to help with itchiness as needed to affect areas (sample provided). ?- I like this one because it can be used from head to toe (without the side effects of steroids) ? ?3. Return in about 6 months (around 06/10/2022).  ? ? ? ? ?Subjective:  ? ?Brian Webster is a 31 y.o. male presenting today for follow up of  ?Chief Complaint  ?Patient presents with  ? Seasonal and Perennial allergic Rhinitis  ?  2 mth f/u - Same. Patient would like to discuss allergy injections  ? ? ?Brian Webster has a history of the following: ?Patient Active Problem List  ? Diagnosis Date Noted  ? Seasonal and perennial allergic rhinitis 10/05/2021  ? Pollen-food allergy syndrome, subsequent encounter 10/05/2021  ? Sensitive skin 10/05/2021  ? Chronic right shoulder pain 07/13/2021  ? History of multiple allergies 07/13/2021  ? Hypothyroidism 07/13/2021  ? Chronic depression 12/29/2020  ? 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 ? ?History obtained from: chart review and patient. ? ?Brian Webster is a 31 y.o. male presenting for a follow up visit.  He was last seen in February 2023.  At that time, testing was positive to multiple indoor and outdoor allergens.  We stopped his Allegra and started Singulair as well as carbinoxamine.  He was not excited about no sprays, so we did not even bother.  For his oral allergy syndrome, we discussed the etiology.  We added on Opzelura twice daily to help with skin pruritus that he was experiencing. ? ?Since the last visit, he has not done well.  Unfortunately, medication changes at the last visit did not do anything to help his symptoms.  He thinks carbinoxamine might have provided a little bit of relief, but otherwise he felt no relief with the montelukast.  Again, he is not interested in nasal sprays at all because he has a history of nosebleeds and they make him feel weird.  He is wanting to discuss allergen immunotherapy today.  He has never been on shots in the past. ? 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  He works second shift, so typically is day does not start until noon or so.  He does have time in the mornings, gets shots.  He does understand that the shots are weekly for a period of time and then spaced out to monthly.  He would like to pursue this for long-term control. ? ?Skin is under good control.  He has a rash on the upper arms.  He has not seen dermatology. ? ?Otherwise, there have been no changes to his past medical history, surgical history, family history, or social history. ? ? ? ?Review of Systems  ?Constitutional: Negative.  Negative for fever, malaise/fatigue and weight loss.  ?HENT:  Positive for congestion and sinus pain. Negative for ear discharge and ear pain.   ?Eyes:  Negative for pain, discharge and redness.  ?Respiratory:  Negative for cough, sputum production, shortness of breath and wheezing.   ?Cardiovascular: Negative.  Negative for  chest pain and palpitations.  ?Gastrointestinal:  Negative for abdominal pain, heartburn, nausea and vomiting.  ?Skin: Negative.  Negative for itching and rash.  ?Neurological:  Negative for dizziness and headaches.  ?Endo/Heme/Allergies:  Positive for environmental allergies. Does not bruise/bleed easily.   ? ? ? ?Objective:  ? ?Blood pressure 128/74, pulse 75, temperature 97.6 ?F (36.4 ?C), resp. rate 16, height 6\' 2"  (1.88 m), weight 281 lb 12.8 oz (127.8 kg), SpO2 97 %. ?Body mass index is 36.18 kg/m?. ? ? ? ?Physical Exam ?Vitals reviewed.  ?Constitutional:   ?   Appearance: He is well-developed.  ?HENT:  ?   Head: Normocephalic and atraumatic.  ?   Right Ear: Tympanic membrane, ear canal and external ear normal.  ?   Left Ear: Tympanic membrane, ear canal and external ear normal.  ?   Nose: No nasal deformity, septal deviation, mucosal edema or rhinorrhea.  ?   Right Turbinates: Enlarged, swollen and pale.  ?   Left Turbinates: Enlarged, swollen and pale.  ?   Right Sinus: No maxillary sinus tenderness or frontal sinus tenderness.  ?   Left Sinus: No maxillary sinus tenderness or frontal sinus tenderness.  ?   Comments: No nasal polyps. ?   Mouth/Throat:  ?   Mouth: Mucous membranes are not pale and not dry.  ?   Pharynx: Uvula midline.  ?Eyes:  ?   General: Lids are normal. No allergic shiner.    ?   Right eye: No discharge.     ?   Left eye: No discharge.  ?   Conjunctiva/sclera: Conjunctivae normal.  ?   Right eye: Right conjunctiva is not injected. No chemosis. ?   Left eye: Left conjunctiva is not injected. No chemosis. ?   Pupils: Pupils are equal, round, and reactive to light.  ?Cardiovascular:  ?   Rate and Rhythm: Normal rate and regular rhythm.  ?   Heart sounds: Normal heart sounds.  ?Pulmonary:  ?   Effort: Pulmonary effort is normal. No tachypnea, accessory muscle usage or respiratory distress.  ?   Breath sounds: Normal breath sounds. No wheezing, rhonchi or rales.  ?   Comments: Moving air well  in all lung fields.  No increased work of breathing. ?Chest:  ?   Chest wall: No tenderness.  ?Lymphadenopathy:  ?   Cervical: No cervical adenopathy.  ?Skin: ?   Coloration: Skin is not pale.  ?   Findings: No abrasion, erythema, petechiae or rash. Rash is not papular, urticarial or vesicular.  ?Neurological:  ?   Mental Status: He is alert.  ?Psychiatric:     ?   Behavior: Behavior is cooperative.  ?  ? ?Diagnostic studies: none ? ? ? ?  ? , MD  ?[facility] ? ? ? ? ? ? ?

## 2021-12-17 NOTE — Addendum Note (Signed)
Addended by: Alfonse Spruce on: 12/17/2021 06:02 AM ? ? Modules accepted: Orders ? ?

## 2021-12-20 DIAGNOSIS — J3081 Allergic rhinitis due to animal (cat) (dog) hair and dander: Secondary | ICD-10-CM | POA: Diagnosis not present

## 2021-12-20 NOTE — Progress Notes (Signed)
Aeroallergen Immunotherapy  ? ?Ordering Provider: Dr. Malachi Bonds  ? ?Patient Details  ?Name: Brian Webster  ?MRN: [id number]  ?Date of Birth: 1990/10/13  ? ?Order 1 of 2  ? ?Vial Label: G/W/T/C/D  ? ?0.3 ml (Volume)  BAU Concentration -- 7 Grass Mix* 100,000 ([address] [place], [place], [place], Ethel R[place], [place], Sweet Vernal, Marcial Pacas)  ?0.2 ml (Volume)  1:20 Concentration -- [place]  ?0.3 ml (Volume)  BAU Concentration -- [place] 10,000  ?0.2 ml (Volume)  1:20 Concentration -- [place]  ?0.2 ml (Volume)  1:20 Concentration -- [place]  ?0.2 ml (Volume)  1:20 Concentration -- Burweed [place]  ?0.5 ml (Volume)  1:20 Concentration -- Weed Mix*  ?0.5 ml (Volume)  1:20 Concentration -- Eastern 10 Tree Mix (also Sweet Gum)  ?0.1 ml (Volume)  1:20 Concentration -- Elm Mix*  ?0.1 ml (Volume)  1:10 Concentration -- [place]*  ?0.1 ml (Volume)  1:20 Concentration -- Maple Mix*  ?0.2 ml (Volume)  1:10 Concentration -- Oak, [place] mix*  ?0.1 ml (Volume)  1:10 Concentration -- [place] Eastern*  ?0.5 ml (Volume)  1:10 Concentration -- Cat Hair  ?0.5 ml (Volume)  1:10 Concentration -- Dog Epithelia  ? ? ?4.0  ml Extract Subtotal  ?1.0  ml Diluent  ?5.0  ml Maintenance Total  ? ?Schedule:  A  ? ?Blue Vial (1:100,000): Schedule A (10 doses)  ?Yellow Vial (1:10,000): Schedule A (10 doses)  ?Green Vial (1:1,000): Schedule A (10 doses)  ?Red Vial (1:100): Schedule A (10 doses)  ? ?Special Instructions: none ?

## 2021-12-20 NOTE — Progress Notes (Signed)
VIALS EXP 12-21-22 ?

## 2021-12-20 NOTE — Progress Notes (Signed)
Aeroallergen Immunotherapy  ? ?Ordering Provider: Dr. Malachi Bonds  ? ?Patient Details  ?Name: Brian Webster  ?MRN: [id number]  ?Date of Birth: 01-31-1991  ? ?Order 2 of 2  ? ?Vial Label: Molds/RW/CR/DM  ? ?0.3 ml (Volume)  1:20 Concentration -- Ragweed Mix  ?0.2 ml (Volume)  1:20 Concentration -- Alternaria alternata  ?0.2 ml (Volume)  1:20 Concentration -- Cladosporium herbarum  ?0.2 ml (Volume)  1:10 Concentration -- Aspergillus mix  ?0.2 ml (Volume)  1:10 Concentration -- Penicillium mix  ?0.2 ml (Volume)  1:20 Concentration -- Bipolaris sorokiniana  ?0.2 ml (Volume)  1:20 Concentration -- Drechslera spicifera  ?0.2 ml (Volume)  1:10 Concentration -- Mucor plumbeus  ?0.2 ml (Volume)  1:10 Concentration -- Fusarium moniliforme  ?0.2 ml (Volume)  1:40 Concentration -- Aureobasidium pullulans  ?0.2 ml (Volume)  1:10 Concentration -- Rhizopus oryzae  ?0.3 ml (Volume)  1:20 Concentration -- Cockroach, [place]  ?0.6 ml (Volume)   AU Concentration -- Mite Mix (DF 5,000 & DP 5,000)  ? ? ?3.2  ml Extract Subtotal  ?1.8  ml Diluent  ?5.0  ml Maintenance Total  ? ?Schedule:  A  ? ?Blue Vial (1:100,000): Schedule A (10 doses)  ?Yellow Vial (1:10,000): Schedule A (10 doses)  ?Green Vial (1:1,000): Schedule A (10 doses)  ?Red Vial (1:100): Schedule A (10 doses)  ? ?Special Instructions: none ?

## 2021-12-21 DIAGNOSIS — J3089 Other allergic rhinitis: Secondary | ICD-10-CM | POA: Diagnosis not present

## 2022-01-04 ENCOUNTER — Ambulatory Visit (INDEPENDENT_AMBULATORY_CARE_PROVIDER_SITE_OTHER): Payer: BC Managed Care – PPO

## 2022-01-04 DIAGNOSIS — J309 Allergic rhinitis, unspecified: Secondary | ICD-10-CM

## 2022-01-04 NOTE — Progress Notes (Signed)
Immunotherapy ? ? ?Patient Details  ?Name: Brian Webster ?MRN: [id number] ?Date of Birth: 04-Jul-1991 ? ?01/04/2022 ? ?Brian Webster started injections for  G-W-T-C-D and M-RW-CR-DM. Patient received 0.05 of both his blue vials with an expiration of 12/21/2022. Patient waited 30 minutes with no problems. ?Following schedule: A  ?Frequency:1 time per week ?Epi-Pen:Epi-Pen Available  ?Consent signed and patient instructions given. ? ? ?Dub Mikes ?01/04/2022, 8:45 AM ? ? ?

## 2022-01-10 ENCOUNTER — Ambulatory Visit (INDEPENDENT_AMBULATORY_CARE_PROVIDER_SITE_OTHER): Payer: BC Managed Care – PPO

## 2022-01-10 DIAGNOSIS — J309 Allergic rhinitis, unspecified: Secondary | ICD-10-CM | POA: Diagnosis not present

## 2022-01-20 ENCOUNTER — Ambulatory Visit (INDEPENDENT_AMBULATORY_CARE_PROVIDER_SITE_OTHER): Payer: BC Managed Care – PPO

## 2022-01-20 DIAGNOSIS — J309 Allergic rhinitis, unspecified: Secondary | ICD-10-CM | POA: Diagnosis not present

## 2022-01-25 ENCOUNTER — Ambulatory Visit (INDEPENDENT_AMBULATORY_CARE_PROVIDER_SITE_OTHER): Payer: BC Managed Care – PPO

## 2022-01-25 DIAGNOSIS — J309 Allergic rhinitis, unspecified: Secondary | ICD-10-CM | POA: Diagnosis not present

## 2022-01-30 ENCOUNTER — Other Ambulatory Visit: Payer: Self-pay | Admitting: Emergency Medicine

## 2022-01-30 DIAGNOSIS — E039 Hypothyroidism, unspecified: Secondary | ICD-10-CM

## 2022-02-01 ENCOUNTER — Ambulatory Visit (INDEPENDENT_AMBULATORY_CARE_PROVIDER_SITE_OTHER): Payer: BC Managed Care – PPO

## 2022-02-01 DIAGNOSIS — J309 Allergic rhinitis, unspecified: Secondary | ICD-10-CM

## 2022-02-11 ENCOUNTER — Ambulatory Visit (INDEPENDENT_AMBULATORY_CARE_PROVIDER_SITE_OTHER): Payer: BC Managed Care – PPO

## 2022-02-11 DIAGNOSIS — J309 Allergic rhinitis, unspecified: Secondary | ICD-10-CM

## 2022-02-16 ENCOUNTER — Ambulatory Visit (INDEPENDENT_AMBULATORY_CARE_PROVIDER_SITE_OTHER): Payer: BC Managed Care – PPO

## 2022-02-16 DIAGNOSIS — J309 Allergic rhinitis, unspecified: Secondary | ICD-10-CM | POA: Diagnosis not present

## 2022-02-23 ENCOUNTER — Ambulatory Visit (INDEPENDENT_AMBULATORY_CARE_PROVIDER_SITE_OTHER): Payer: BC Managed Care – PPO

## 2022-02-23 DIAGNOSIS — J309 Allergic rhinitis, unspecified: Secondary | ICD-10-CM

## 2022-03-04 ENCOUNTER — Ambulatory Visit (INDEPENDENT_AMBULATORY_CARE_PROVIDER_SITE_OTHER): Payer: BC Managed Care – PPO

## 2022-03-04 DIAGNOSIS — J309 Allergic rhinitis, unspecified: Secondary | ICD-10-CM

## 2022-03-07 ENCOUNTER — Ambulatory Visit (INDEPENDENT_AMBULATORY_CARE_PROVIDER_SITE_OTHER): Payer: BC Managed Care – PPO

## 2022-03-07 DIAGNOSIS — J309 Allergic rhinitis, unspecified: Secondary | ICD-10-CM

## 2022-03-14 ENCOUNTER — Encounter: Payer: Self-pay | Admitting: Emergency Medicine

## 2022-03-14 ENCOUNTER — Ambulatory Visit (INDEPENDENT_AMBULATORY_CARE_PROVIDER_SITE_OTHER): Payer: BC Managed Care – PPO | Admitting: Emergency Medicine

## 2022-03-14 ENCOUNTER — Ambulatory Visit (INDEPENDENT_AMBULATORY_CARE_PROVIDER_SITE_OTHER): Payer: BC Managed Care – PPO

## 2022-03-14 VITALS — BP 146/98 | HR 67 | Temp 97.8°F | Ht 74.0 in | Wt 279.1 lb

## 2022-03-14 DIAGNOSIS — Z0000 Encounter for general adult medical examination without abnormal findings: Secondary | ICD-10-CM | POA: Diagnosis not present

## 2022-03-14 DIAGNOSIS — J309 Allergic rhinitis, unspecified: Secondary | ICD-10-CM | POA: Diagnosis not present

## 2022-03-14 DIAGNOSIS — Z1329 Encounter for screening for other suspected endocrine disorder: Secondary | ICD-10-CM | POA: Diagnosis not present

## 2022-03-14 DIAGNOSIS — Z13228 Encounter for screening for other metabolic disorders: Secondary | ICD-10-CM | POA: Diagnosis not present

## 2022-03-14 DIAGNOSIS — Z130 Encounter for screening for diseases of the blood and blood-forming organs and certain disorders involving the immune mechanism: Secondary | ICD-10-CM | POA: Diagnosis not present

## 2022-03-14 DIAGNOSIS — Z114 Encounter for screening for human immunodeficiency virus [HIV]: Secondary | ICD-10-CM

## 2022-03-14 DIAGNOSIS — R030 Elevated blood-pressure reading, without diagnosis of hypertension: Secondary | ICD-10-CM

## 2022-03-14 DIAGNOSIS — Z13220 Encounter for screening for lipoid disorders: Secondary | ICD-10-CM

## 2022-03-14 DIAGNOSIS — Z1159 Encounter for screening for other viral diseases: Secondary | ICD-10-CM

## 2022-03-14 DIAGNOSIS — E039 Hypothyroidism, unspecified: Secondary | ICD-10-CM

## 2022-03-14 LAB — CBC WITH DIFFERENTIAL/PLATELET
Basophils Absolute: 0 10*3/uL (ref 0.0–0.1)
Basophils Relative: 1.1 % (ref 0.0–3.0)
Eosinophils Absolute: 0 10*3/uL (ref 0.0–0.7)
Eosinophils Relative: 0.8 % (ref 0.0–5.0)
HCT: 42.5 % (ref 39.0–52.0)
Hemoglobin: 14.6 g/dL (ref 13.0–17.0)
Lymphocytes Relative: 56.9 % — ABNORMAL HIGH (ref 12.0–46.0)
Lymphs Abs: 2.2 10*3/uL (ref 0.7–4.0)
MCHC: 34.4 g/dL (ref 30.0–36.0)
MCV: 83.2 fl (ref 78.0–100.0)
Monocytes Absolute: 0.3 10*3/uL (ref 0.1–1.0)
Monocytes Relative: 6.5 % (ref 3.0–12.0)
Neutro Abs: 1.4 10*3/uL (ref 1.4–7.7)
Neutrophils Relative %: 34.7 % — ABNORMAL LOW (ref 43.0–77.0)
Platelets: 200 10*3/uL (ref 150.0–400.0)
RBC: 5.11 Mil/uL (ref 4.22–5.81)
RDW: 14.1 % (ref 11.5–15.5)
WBC: 3.9 10*3/uL — ABNORMAL LOW (ref 4.0–10.5)

## 2022-03-14 LAB — COMPREHENSIVE METABOLIC PANEL
ALT: 26 U/L (ref 0–53)
AST: 22 U/L (ref 0–37)
Albumin: 4.7 g/dL (ref 3.5–5.2)
Alkaline Phosphatase: 34 U/L — ABNORMAL LOW (ref 39–117)
BUN: 14 mg/dL (ref 6–23)
CO2: 27 mEq/L (ref 19–32)
Calcium: 9.6 mg/dL (ref 8.4–10.5)
Chloride: 103 mEq/L (ref 96–112)
Creatinine, Ser: 0.98 mg/dL (ref 0.40–1.50)
GFR: 102.05 mL/min (ref >60.00)
Glucose, Bld: 87 mg/dL (ref 70–99)
Potassium: 3.7 mEq/L (ref 3.5–5.1)
Sodium: 137 mEq/L (ref 135–145)
Total Bilirubin: 0.8 mg/dL (ref 0.2–1.2)
Total Protein: 7.5 g/dL (ref 6.0–8.3)

## 2022-03-14 LAB — LIPID PANEL
Cholesterol: 233 mg/dL — ABNORMAL HIGH (ref 0–200)
HDL: 41.1 mg/dL (ref >39.00)
LDL Cholesterol: 171 mg/dL — ABNORMAL HIGH (ref 0–99)
NonHDL: 191.99
Total CHOL/HDL Ratio: 6
Triglycerides: 104 mg/dL (ref 0.0–149.0)
VLDL: 20.8 mg/dL (ref 0.0–40.0)

## 2022-03-14 LAB — TSH: TSH: 8.07 u[IU]/mL — ABNORMAL HIGH (ref 0.35–5.50)

## 2022-03-14 LAB — HEMOGLOBIN A1C: Hgb A1c MFr Bld: 5.8 % (ref 4.6–6.5)

## 2022-03-14 NOTE — Patient Instructions (Signed)
Health Maintenance, Male Adopting a healthy lifestyle and getting preventive care are important in promoting health and wellness. Ask your health care provider about: The right schedule for you to have regular tests and exams. Things you can do on your own to prevent diseases and keep yourself healthy. What should I know about diet, weight, and exercise? Eat a healthy diet  Eat a diet that includes plenty of vegetables, fruits, low-fat dairy products, and lean protein. Do not eat a lot of foods that are high in solid fats, added sugars, or sodium. Maintain a healthy weight Body mass index (BMI) is a measurement that can be used to identify possible weight problems. It estimates body fat based on height and weight. Your health care provider can help determine your BMI and help you achieve or maintain a healthy weight. Get regular exercise Get regular exercise. This is one of the most important things you can do for your health. Most adults should: Exercise for at least 150 minutes each week. The exercise should increase your heart rate and make you sweat (moderate-intensity exercise). Do strengthening exercises at least twice a week. This is in addition to the moderate-intensity exercise. Spend less time sitting. Even light physical activity can be beneficial. Watch cholesterol and blood lipids Have your blood tested for lipids and cholesterol at 32 years of age, then have this test every 5 years. You may need to have your cholesterol levels checked more often if: Your lipid or cholesterol levels are high. You are older than 32 years of age. You are at high risk for heart disease. What should I know about cancer screening? Many types of cancers can be detected early and may often be prevented. Depending on your health history and family history, you may need to have cancer screening at various ages. This may include screening for: Colorectal cancer. Prostate cancer. Skin cancer. Lung  cancer. What should I know about heart disease, diabetes, and high blood pressure? Blood pressure and heart disease High blood pressure causes heart disease and increases the risk of stroke. This is more likely to develop in people who have high blood pressure readings or are overweight. Talk with your health care provider about your target blood pressure readings. Have your blood pressure checked: Every 3-5 years if you are 18-39 years of age. Every year if you are 40 years old or older. If you are between the ages of 65 and 75 and are a current or former smoker, ask your health care provider if you should have a one-time screening for abdominal aortic aneurysm (AAA). Diabetes Have regular diabetes screenings. This checks your fasting blood sugar level. Have the screening done: Once every three years after age 45 if you are at a normal weight and have a low risk for diabetes. More often and at a younger age if you are overweight or have a high risk for diabetes. What should I know about preventing infection? Hepatitis B If you have a higher risk for hepatitis B, you should be screened for this virus. Talk with your health care provider to find out if you are at risk for hepatitis B infection. Hepatitis C Blood testing is recommended for: Everyone born from 1945 through 1965. Anyone with known risk factors for hepatitis C. Sexually transmitted infections (STIs) You should be screened each year for STIs, including gonorrhea and chlamydia, if: You are sexually active and are younger than 32 years of age. You are older than 32 years of age and your   health care provider tells you that you are at risk for this type of infection. Your sexual activity has changed since you were last screened, and you are at increased risk for chlamydia or gonorrhea. Ask your health care provider if you are at risk. Ask your health care provider about whether you are at high risk for HIV. Your health care provider  may recommend a prescription medicine to help prevent HIV infection. If you choose to take medicine to prevent HIV, you should first get tested for HIV. You should then be tested every 3 months for as long as you are taking the medicine. Follow these instructions at home: Alcohol use Do not drink alcohol if your health care provider tells you not to drink. If you drink alcohol: Limit how much you have to 0-2 drinks a day. Know how much alcohol is in your drink. In the [place] one drink equals one 12 oz bottle of beer (355 mL), one 5 oz glass of wine (148 mL), or one 1 oz glass of hard liquor (44 mL). Lifestyle Do not use any products that contain nicotine or tobacco. These products include cigarettes, chewing tobacco, and vaping devices, such as e-cigarettes. If you need help quitting, ask your health care provider. Do not use street drugs. Do not share needles. Ask your health care provider for help if you need support or information about quitting drugs. General instructions Schedule regular health, dental, and eye exams. Stay current with your vaccines. Tell your health care provider if: You often feel depressed. You have ever been abused or do not feel safe at home. Summary Adopting a healthy lifestyle and getting preventive care are important in promoting health and wellness. Follow your health care provider's instructions about healthy diet, exercising, and getting tested or screened for diseases. Follow your health care provider's instructions on monitoring your cholesterol and blood pressure. This information is not intended to replace advice given to you by your health care provider. Make sure you discuss any questions you have with your health care provider. Document Revised: 12/28/2020 Document Reviewed: 12/28/2020 Elsevier Patient Education  2023 [employer]  

## 2022-03-14 NOTE — Progress Notes (Signed)
Brian Webster 31 y.o.   Chief Complaint  Patient presents with   Annual Exam    Thyroid concerns     HISTORY OF PRESENT ILLNESS: This is a 31 y.o. male here for annual exam. Has history of hypothyroidism presently on Synthroid 50 mcg daily. Lab Results  Component Value Date   TSH 6.03 (H) 12/29/2020  No complaints or medical concerns today. Healthy lifestyle.  Eating better and exercising more.  Trying to lose some weight.   HPI   Prior to Admission medications   Medication Sig Start Date End Date Taking? Authorizing Provider  Carbinoxamine Maleate 4 MG TABS Take 1 tablet (4 mg total) by mouth every 8 (eight) hours as needed. 10/05/21   Alfonse Spruce, MD  Fexofenadine HCl [facility] ALLERGY PO) Take by mouth as needed.    [provider]  levothyroxine (SYNTHROID) 50 MCG tablet TAKE 1 TABLET(50 MCG) BY MOUTH DAILY 01/30/22   Georgina Quint, MD  montelukast (SINGULAIR) 10 MG tablet Take 1 tablet (10 mg total) by mouth at bedtime. 10/05/21   Alfonse Spruce, MD  Multiple Vitamin (MULTIVITAMIN WITH MINERALS) TABS tablet Take 1 tablet by mouth daily.    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    Past Medical History:  Diagnosis Date   Asthma    Recurrent upper respiratory infection (URI)     Past Surgical History:  Procedure Laterality Date   TONSILECTOMY, ADENOIDECTOMY, BILATERAL MYRINGOTOMY AND TUBES      Social History   Socioeconomic History   Marital status: Single    Spouse name: Not on file   Number of children: Not on file   Years of education: Not  on file   Highest education level: Not on file  Occupational History   Not on file  Tobacco Use   Smoking status: Never    Passive exposure: Never   Smokeless tobacco: Never  Vaping Use   Vaping Use: Never used  Substance and Sexual Activity   Alcohol use: Yes   Drug use: No   Sexual activity: Yes  Other Topics Concern   Not on file  Social History Narrative   Not on file   Social Determinants of Health   Financial Resource Strain: Not on file  Food Insecurity: Not on file  Transportation Needs: Not on file  Physical Activity: Not on file  Stress: Not on file  Social Connections: Not on file  Intimate Partner Violence: Not on file    Family History  Problem Relation Age of Onset   Heart disease Mother    Hypertension Mother    Allergic rhinitis Brother    Allergic rhinitis Son      Review of Systems  Constitutional: Negative.  Negative for chills and fever.  HENT: Negative.  Negative for congestion and sore throat.   Eyes: Negative.   Respiratory: Negative.  Negative for cough and shortness of breath.   Cardiovascular: Negative.  Negative for chest pain and palpitations.  Gastrointestinal: Negative.  Negative for abdominal pain, diarrhea, nausea and vomiting.  Genitourinary: Negative.   Musculoskeletal: Negative.   Skin: Negative.  Negative for  rash.  Neurological:  Negative for dizziness and headaches.  All other systems reviewed and are negative.  Today's Vitals   03/14/22 0940 03/14/22 0941  BP: (!) 140/92 (!) 146/98  Pulse: 67   Temp: 97.8 F (36.6 C)   TempSrc: Oral   SpO2: 98%   Weight: 279 lb 2 oz (126.6 kg)   Height: 6\' 2"  (1.88 m)    Body mass index is 35.84 kg/m.   Physical Exam Vitals reviewed.  Constitutional:      Appearance: Normal appearance.  HENT:     Head: Normocephalic.     Right Ear: Tympanic membrane, ear canal and external ear normal.     Left Ear: Tympanic membrane, ear canal and external ear normal.  Eyes:      Extraocular Movements: Extraocular movements intact.     Conjunctiva/sclera: Conjunctivae normal.     Pupils: Pupils are equal, round, and reactive to light.  Cardiovascular:     Rate and Rhythm: Normal rate and regular rhythm.     Pulses: Normal pulses.     Heart sounds: Normal heart sounds.  Pulmonary:     Effort: Pulmonary effort is normal.     Breath sounds: Normal breath sounds.  Abdominal:     General: There is no distension.     Palpations: Abdomen is soft.     Tenderness: There is no abdominal tenderness.  Musculoskeletal:     Cervical back: No tenderness.  Lymphadenopathy:     Cervical: No cervical adenopathy.  Skin:    General: Skin is warm and dry.     Capillary Refill: Capillary refill takes less than 2 seconds.  Neurological:     General: No focal deficit present.     Mental Status: He is alert and oriented to person, place, and time.  Psychiatric:        Mood and Affect: Mood normal.        Behavior: Behavior normal.     ASSESSMENT & PLAN: Problem List Items Addressed This Visit       Endocrine   Hypothyroidism   Relevant Orders   TSH   Other Visit Diagnoses     Routine general medical examination at a health care facility    -  Primary   Need for hepatitis C screening test       Relevant Orders   Hepatitis C antibody screen   Screening for HIV (human immunodeficiency virus)       Relevant Orders   HIV antibody   Screening for deficiency anemia       Relevant Orders   CBC with Differential   Screening for lipoid disorders       Relevant Orders   Lipid panel   Screening for endocrine, metabolic and immunity disorder       Relevant Orders   Comprehensive metabolic panel   Hemoglobin A1c   Elevated blood-pressure reading, without diagnosis of hypertension         Modifiable risk factors discussed with patient. Anticipatory guidance according to age provided. The following topics were also discussed: Social Determinants of Health Smoking.   Non-smoker Diet and nutrition and need to decrease amount of daily carbohydrate intake and daily calories Benefits of exercise Cancer family history review Vaccinations recommendations Cardiovascular risk assessment and need for blood work Finding of elevated blood pressure reading in the office and need to monitor blood pressure readings at home daily for the next several weeks and keep a log Diagnosis of hypothyroidism and need  for blood work Mental health including depression and anxiety Fall and accident prevention  Patient Instructions  Health Maintenance, Male Adopting a healthy lifestyle and getting preventive care are important in promoting health and wellness. Ask your health care provider about: The right schedule for you to have regular tests and exams. Things you can do on your own to prevent diseases and keep yourself healthy. What should I know about diet, weight, and exercise? Eat a healthy diet  Eat a diet that includes plenty of vegetables, fruits, low-fat dairy products, and lean protein. Do not eat a lot of foods that are high in solid fats, added sugars, or sodium. Maintain a healthy weight Body mass index (BMI) is a measurement that can be used to identify possible weight problems. It estimates body fat based on height and weight. Your health care provider can help determine your BMI and help you achieve or maintain a healthy weight. Get regular exercise Get regular exercise. This is one of the most important things you can do for your health. Most adults should: Exercise for at least 150 minutes each week. The exercise should increase your heart rate and make you sweat (moderate-intensity exercise). Do strengthening exercises at least twice a week. This is in addition to the moderate-intensity exercise. Spend less time sitting. Even light physical activity can be beneficial. Watch cholesterol and blood lipids Have your blood tested for lipids and cholesterol at 32  years of age, then have this test every 5 years. You may need to have your cholesterol levels checked more often if: Your lipid or cholesterol levels are high. You are older than 32 years of age. You are at high risk for heart disease. What should I know about cancer screening? Many types of cancers can be detected early and may often be prevented. Depending on your health history and family history, you may need to have cancer screening at various ages. This may include screening for: Colorectal cancer. Prostate cancer. Skin cancer. Lung cancer. What should I know about heart disease, diabetes, and high blood pressure? Blood pressure and heart disease High blood pressure causes heart disease and increases the risk of stroke. This is more likely to develop in people who have high blood pressure readings or are overweight. Talk with your health care provider about your target blood pressure readings. Have your blood pressure checked: Every 3-5 years if you are 68-56 years of age. Every year if you are 47 years old or older. If you are between the ages of 41 and 85 and are a current or former smoker, ask your health care provider if you should have a one-time screening for abdominal aortic aneurysm (AAA). Diabetes Have regular diabetes screenings. This checks your fasting blood sugar level. Have the screening done: Once every three years after age 22 if you are at a normal weight and have a low risk for diabetes. More often and at a younger age if you are overweight or have a high risk for diabetes. What should I know about preventing infection? Hepatitis B If you have a higher risk for hepatitis B, you should be screened for this virus. Talk with your health care provider to find out if you are at risk for hepatitis B infection. Hepatitis C Blood testing is recommended for: Everyone born from [birth year] through 1966. Anyone with known risk factors for hepatitis C. Sexually transmitted  infections (STIs) You should be screened each year for STIs, including gonorrhea and chlamydia, if: You are sexually active  and are younger than 32 years of age. You are older than 32 years of age and your health care provider tells you that you are at risk for this type of infection. Your sexual activity has changed since you were last screened, and you are at increased risk for chlamydia or gonorrhea. Ask your health care provider if you are at risk. Ask your health care provider about whether you are at high risk for HIV. Your health care provider may recommend a prescription medicine to help prevent HIV infection. If you choose to take medicine to prevent HIV, you should first get tested for HIV. You should then be tested every 3 months for as long as you are taking the medicine. Follow these instructions at home: Alcohol use Do not drink alcohol if your health care provider tells you not to drink. If you drink alcohol: Limit how much you have to 0-2 drinks a day. Know how much alcohol is in your drink. In the [place] one drink equals one 12 oz bottle of beer (355 mL), one 5 oz glass of wine (148 mL), or one 1 oz glass of hard liquor (44 mL). Lifestyle Do not use any products that contain nicotine or tobacco. These products include cigarettes, chewing tobacco, and vaping devices, such as e-cigarettes. If you need help quitting, ask your health care provider. Do not use street drugs. Do not share needles. Ask your health care provider for help if you need support or information about quitting drugs. General instructions Schedule regular health, dental, and eye exams. Stay current with your vaccines. Tell your health care provider if: You often feel depressed. You have ever been abused or do not feel safe at home. Summary Adopting a healthy lifestyle and getting preventive care are important in promoting health and wellness. Follow your health care provider's instructions about healthy  diet, exercising, and getting tested or screened for diseases. Follow your health care provider's instructions on monitoring your cholesterol and blood pressure. This information is not intended to replace advice given to you by your health care provider. Make sure you discuss any questions you have with your health care provider. Document Revised: 12/28/2020 Document Reviewed: 12/28/2020 Elsevier Patient Education  2023 Elsevier Inc.      Edwina Barth, MD [facility]

## 2022-03-15 ENCOUNTER — Other Ambulatory Visit: Payer: Self-pay | Admitting: Emergency Medicine

## 2022-03-15 DIAGNOSIS — E039 Hypothyroidism, unspecified: Secondary | ICD-10-CM

## 2022-03-15 LAB — HIV ANTIBODY (ROUTINE TESTING W REFLEX): HIV 1&2 Ab, 4th Generation: NONREACTIVE

## 2022-03-15 LAB — HEPATITIS C ANTIBODY: Hepatitis C Ab: NONREACTIVE

## 2022-03-15 MED ORDER — LEVOTHYROXINE SODIUM 100 MCG PO TABS: 100.0000 ug | ORAL_TABLET | Freq: Every day | ORAL | 3 refills | Status: DC

## 2022-03-23 ENCOUNTER — Ambulatory Visit (INDEPENDENT_AMBULATORY_CARE_PROVIDER_SITE_OTHER): Payer: BC Managed Care – PPO

## 2022-03-23 DIAGNOSIS — J309 Allergic rhinitis, unspecified: Secondary | ICD-10-CM | POA: Diagnosis not present

## 2022-04-04 ENCOUNTER — Ambulatory Visit (INDEPENDENT_AMBULATORY_CARE_PROVIDER_SITE_OTHER): Payer: BC Managed Care – PPO | Admitting: *Deleted

## 2022-04-04 DIAGNOSIS — J309 Allergic rhinitis, unspecified: Secondary | ICD-10-CM | POA: Diagnosis not present

## 2022-04-24 IMAGING — DX DG SHOULDER 2+V*R*
3 series · 3 of 3 positions shown · non-contrast
Comparison: No priors.

CLINICAL DATA: 30-year-old male with history of chronic right
shoulder pain, worsening over the past 6-7 weeks.

EXAM:
RIGHT SHOULDER - 2+ VIEW

[shoulder ap (1 of 2)]
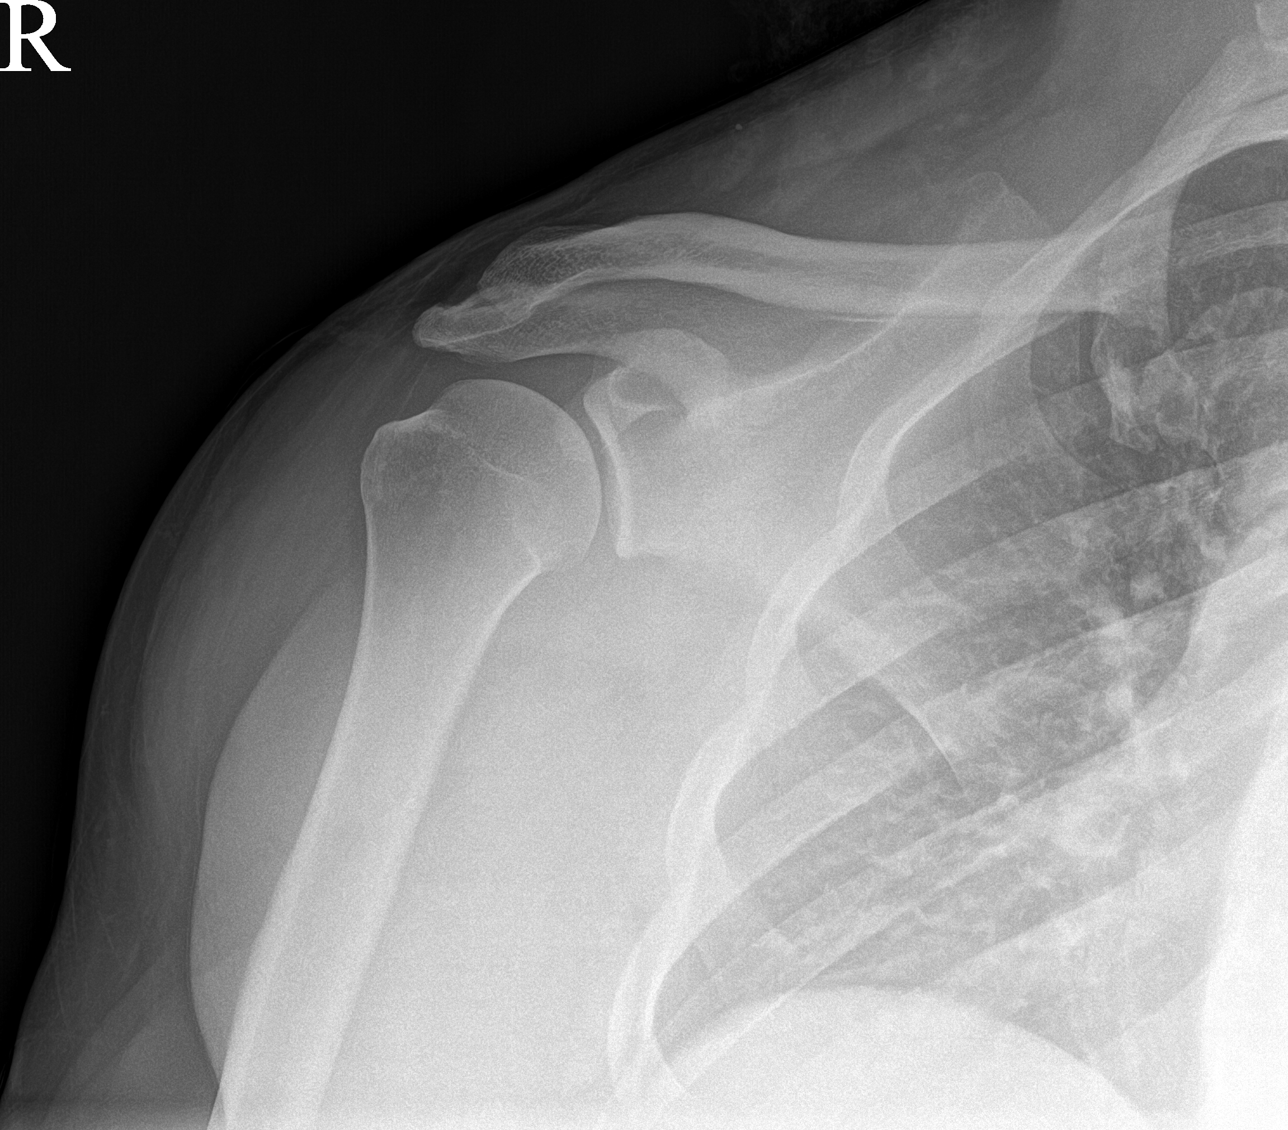

[shoulder ap (2 of 2)]
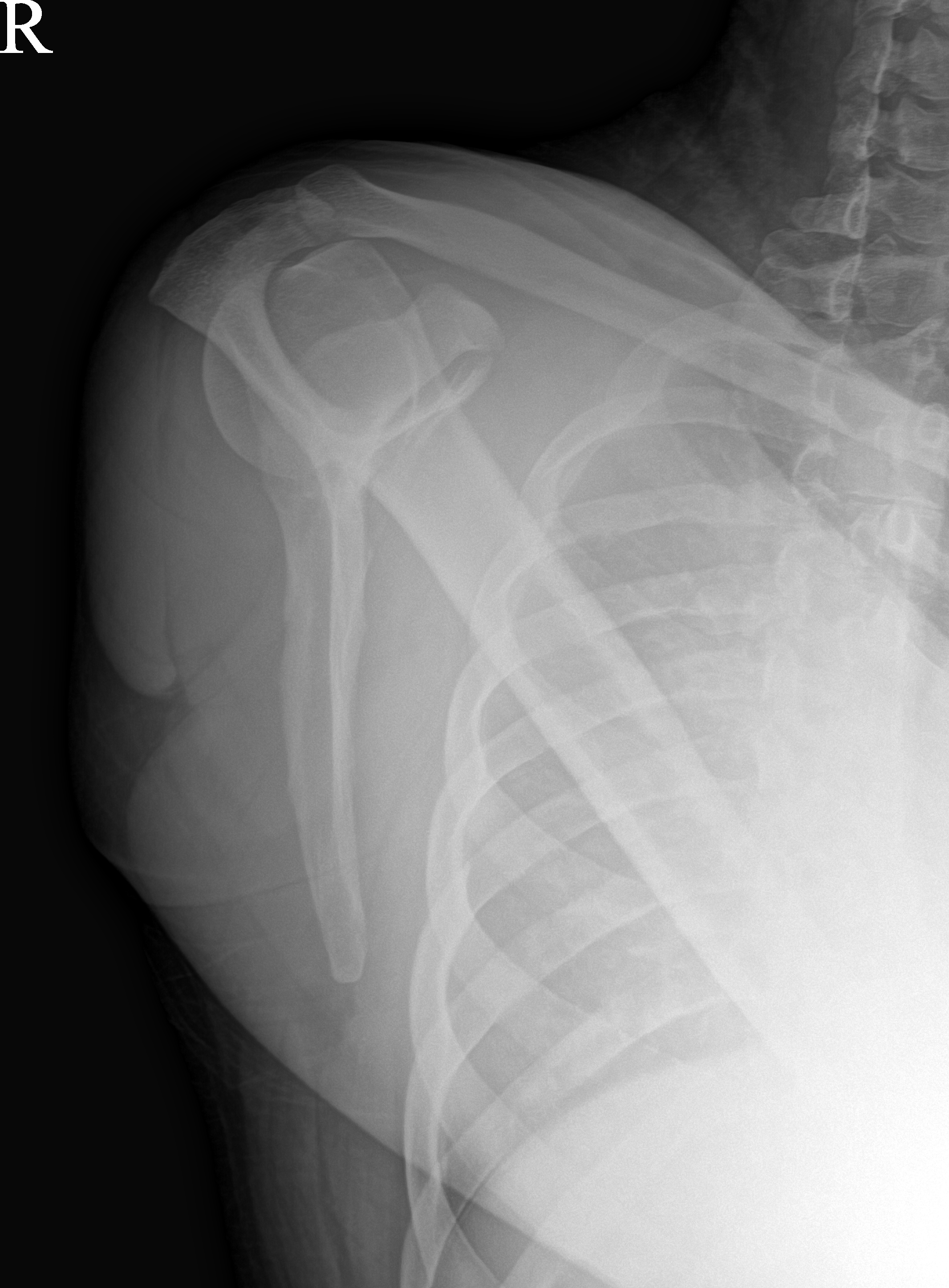

[shoulder axial]
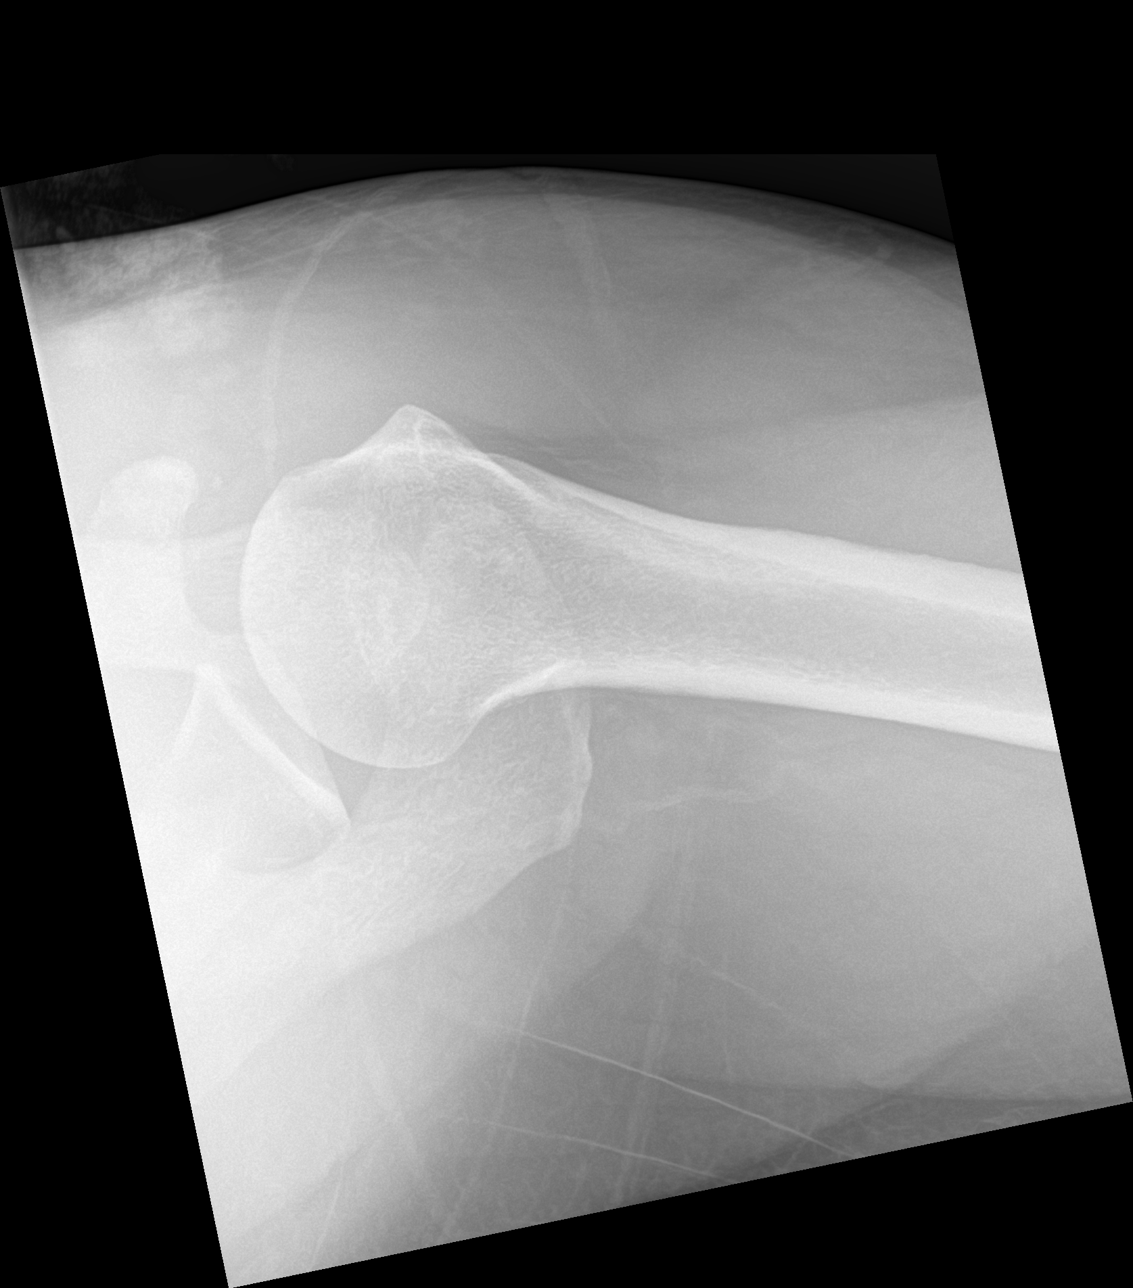

[3 of 3 positions shown; findings below may reference images not displayed]

FINDINGS: There is no evidence of fracture or dislocation. There is no
evidence of arthropathy or other focal bone abnormality. Soft
tissues are unremarkable.
IMPRESSION: Negative.

## 2022-05-02 ENCOUNTER — Ambulatory Visit (INDEPENDENT_AMBULATORY_CARE_PROVIDER_SITE_OTHER): Payer: BC Managed Care – PPO

## 2022-05-02 DIAGNOSIS — J309 Allergic rhinitis, unspecified: Secondary | ICD-10-CM | POA: Diagnosis not present

## 2022-05-09 ENCOUNTER — Ambulatory Visit (INDEPENDENT_AMBULATORY_CARE_PROVIDER_SITE_OTHER): Payer: BC Managed Care – PPO | Admitting: *Deleted

## 2022-05-09 DIAGNOSIS — J309 Allergic rhinitis, unspecified: Secondary | ICD-10-CM

## 2022-05-18 ENCOUNTER — Ambulatory Visit (INDEPENDENT_AMBULATORY_CARE_PROVIDER_SITE_OTHER): Payer: BC Managed Care – PPO | Admitting: *Deleted

## 2022-05-18 DIAGNOSIS — J309 Allergic rhinitis, unspecified: Secondary | ICD-10-CM

## 2022-05-27 ENCOUNTER — Ambulatory Visit (INDEPENDENT_AMBULATORY_CARE_PROVIDER_SITE_OTHER): Payer: BC Managed Care – PPO

## 2022-05-27 DIAGNOSIS — J309 Allergic rhinitis, unspecified: Secondary | ICD-10-CM

## 2022-06-10 ENCOUNTER — Ambulatory Visit (INDEPENDENT_AMBULATORY_CARE_PROVIDER_SITE_OTHER): Payer: BC Managed Care – PPO | Admitting: *Deleted

## 2022-06-10 DIAGNOSIS — J309 Allergic rhinitis, unspecified: Secondary | ICD-10-CM

## 2022-06-15 ENCOUNTER — Ambulatory Visit (INDEPENDENT_AMBULATORY_CARE_PROVIDER_SITE_OTHER): Payer: BC Managed Care – PPO | Admitting: *Deleted

## 2022-06-15 DIAGNOSIS — J309 Allergic rhinitis, unspecified: Secondary | ICD-10-CM | POA: Diagnosis not present

## 2022-06-24 ENCOUNTER — Ambulatory Visit (INDEPENDENT_AMBULATORY_CARE_PROVIDER_SITE_OTHER): Payer: BC Managed Care – PPO

## 2022-06-24 DIAGNOSIS — J309 Allergic rhinitis, unspecified: Secondary | ICD-10-CM | POA: Diagnosis not present

## 2022-07-05 ENCOUNTER — Ambulatory Visit (INDEPENDENT_AMBULATORY_CARE_PROVIDER_SITE_OTHER): Payer: BC Managed Care – PPO

## 2022-07-05 DIAGNOSIS — J309 Allergic rhinitis, unspecified: Secondary | ICD-10-CM

## 2022-07-13 ENCOUNTER — Ambulatory Visit (INDEPENDENT_AMBULATORY_CARE_PROVIDER_SITE_OTHER): Payer: BC Managed Care – PPO | Admitting: *Deleted

## 2022-07-13 DIAGNOSIS — J309 Allergic rhinitis, unspecified: Secondary | ICD-10-CM | POA: Diagnosis not present

## 2022-07-28 ENCOUNTER — Ambulatory Visit (INDEPENDENT_AMBULATORY_CARE_PROVIDER_SITE_OTHER): Payer: BC Managed Care – PPO

## 2022-07-28 DIAGNOSIS — J309 Allergic rhinitis, unspecified: Secondary | ICD-10-CM

## 2022-08-05 ENCOUNTER — Ambulatory Visit (INDEPENDENT_AMBULATORY_CARE_PROVIDER_SITE_OTHER): Payer: BC Managed Care – PPO

## 2022-08-05 DIAGNOSIS — J309 Allergic rhinitis, unspecified: Secondary | ICD-10-CM | POA: Diagnosis not present

## 2022-08-09 ENCOUNTER — Ambulatory Visit (INDEPENDENT_AMBULATORY_CARE_PROVIDER_SITE_OTHER): Payer: BC Managed Care – PPO

## 2022-08-09 DIAGNOSIS — J309 Allergic rhinitis, unspecified: Secondary | ICD-10-CM | POA: Diagnosis not present

## 2022-08-23 ENCOUNTER — Ambulatory Visit (INDEPENDENT_AMBULATORY_CARE_PROVIDER_SITE_OTHER): Payer: BC Managed Care – PPO

## 2022-08-23 DIAGNOSIS — J309 Allergic rhinitis, unspecified: Secondary | ICD-10-CM

## 2022-08-30 ENCOUNTER — Ambulatory Visit (INDEPENDENT_AMBULATORY_CARE_PROVIDER_SITE_OTHER): Payer: BC Managed Care – PPO | Admitting: Allergy & Immunology

## 2022-08-30 DIAGNOSIS — J309 Allergic rhinitis, unspecified: Secondary | ICD-10-CM

## 2022-09-09 ENCOUNTER — Ambulatory Visit (INDEPENDENT_AMBULATORY_CARE_PROVIDER_SITE_OTHER): Payer: BC Managed Care – PPO

## 2022-09-09 DIAGNOSIS — J309 Allergic rhinitis, unspecified: Secondary | ICD-10-CM | POA: Diagnosis not present

## 2022-09-13 ENCOUNTER — Ambulatory Visit (INDEPENDENT_AMBULATORY_CARE_PROVIDER_SITE_OTHER): Payer: BC Managed Care – PPO

## 2022-09-13 DIAGNOSIS — J309 Allergic rhinitis, unspecified: Secondary | ICD-10-CM | POA: Diagnosis not present

## 2022-09-23 ENCOUNTER — Telehealth: Payer: Self-pay | Admitting: *Deleted

## 2022-09-23 ENCOUNTER — Ambulatory Visit (INDEPENDENT_AMBULATORY_CARE_PROVIDER_SITE_OTHER): Payer: BC Managed Care – PPO | Admitting: *Deleted

## 2022-09-23 DIAGNOSIS — J309 Allergic rhinitis, unspecified: Secondary | ICD-10-CM

## 2022-09-23 NOTE — Telephone Encounter (Signed)
I guess we could fill out FMLA forms....  Does he have a visit coming up so we can discuss more? Maybe the NPs have an open slot?   Salvatore Marvel, MD [facility]

## 2022-09-23 NOTE — Telephone Encounter (Signed)
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 He also states that when he has bad allergies where he can't stop sneezing consistently he has to call out of work. He states that he was just put in written warning because of his attendance and was wondering if there was something we could do to help him when he has to miss work because of those types of instances.

## 2022-09-23 NOTE — Telephone Encounter (Signed)
Patient made an appointment with Thurston Hole for 10/07/22 at 3:30pm. To go over possible need for FMLA forms due to allergies.

## 2022-09-29 ENCOUNTER — Ambulatory Visit (INDEPENDENT_AMBULATORY_CARE_PROVIDER_SITE_OTHER): Payer: BC Managed Care – PPO

## 2022-09-29 DIAGNOSIS — J309 Allergic rhinitis, unspecified: Secondary | ICD-10-CM | POA: Diagnosis not present

## 2022-10-07 ENCOUNTER — Encounter: Payer: Self-pay | Admitting: Family Medicine

## 2022-10-07 ENCOUNTER — Other Ambulatory Visit: Payer: Self-pay

## 2022-10-07 ENCOUNTER — Ambulatory Visit (INDEPENDENT_AMBULATORY_CARE_PROVIDER_SITE_OTHER): Payer: BC Managed Care – PPO | Admitting: Family Medicine

## 2022-10-07 VITALS — BP 122/80 | HR 82 | Temp 97.9°F | Resp 18 | Ht 74.0 in | Wt 284.3 lb

## 2022-10-07 DIAGNOSIS — J309 Allergic rhinitis, unspecified: Secondary | ICD-10-CM

## 2022-10-07 DIAGNOSIS — J4520 Mild intermittent asthma, uncomplicated: Secondary | ICD-10-CM | POA: Diagnosis not present

## 2022-10-07 DIAGNOSIS — J4521 Mild intermittent asthma with (acute) exacerbation: Secondary | ICD-10-CM

## 2022-10-07 DIAGNOSIS — J302 Other seasonal allergic rhinitis: Secondary | ICD-10-CM

## 2022-10-07 DIAGNOSIS — T781XXD Other adverse food reactions, not elsewhere classified, subsequent encounter: Secondary | ICD-10-CM

## 2022-10-07 DIAGNOSIS — T7819XD Other adverse food reactions, not elsewhere classified, subsequent encounter: Secondary | ICD-10-CM

## 2022-10-07 MED ORDER — ALBUTEROL SULFATE HFA 108 (90 BASE) MCG/ACT IN AERS: 2.0000 | INHALATION_SPRAY | RESPIRATORY_TRACT | 2 refills | Status: AC | PRN

## 2022-10-07 MED ORDER — RYALTRIS 665-25 MCG/ACT NA SUSP: 2.0000 | Freq: Two times a day (BID) | NASAL | 2 refills | Status: DC | PRN

## 2022-10-07 MED ORDER — ALBUTEROL SULFATE HFA 108 (90 BASE) MCG/ACT IN AERS: 2.0000 | INHALATION_SPRAY | RESPIRATORY_TRACT | 2 refills | Status: DC | PRN

## 2022-10-07 MED ORDER — EPINEPHRINE 0.3 MG/0.3ML IJ SOAJ: 0.3000 mg | INTRAMUSCULAR | 1 refills | Status: DC | PRN

## 2022-10-07 MED ORDER — MONTELUKAST SODIUM 10 MG PO TABS: 10.0000 mg | ORAL_TABLET | Freq: Every day | ORAL | 5 refills | Status: DC

## 2022-10-07 NOTE — Progress Notes (Signed)
   [place] [id number] Dept: [phone]  FOLLOW UP NOTE  Patient ID: Brian Webster, male    DOB: 1991/02/23  Age: 32 y.o. MRN: [id number] Date of Office Visit: 10/07/2022  Assessment  Chief Complaint: No chief complaint on file.  HPI Brian Webster is a 32 year old male who presents to the clinic for follow-up visit.  He was last seen in this clinic on 12/09/2021 by Dr. Ernst Bowler for evaluation of allergic rhinitis on allergen immunotherapy, oral allergy syndrome, and sensitive skin.  Allergic rhinitis continue allergen avoidance measures directed toward  Drug Allergies:  No Known Allergies  Physical Exam: There were no vitals taken for this visit.   Physical Exam  Diagnostics: FVC 4.37 which is 83% of predicted value, FEV1 3.87 which is 90% of predicted value.  Spirometry indicates normal ventilatory function.  Postbronchodilator spirometry indicates no significant change in FEV.  Assessment and Plan: No diagnosis found.  No orders of the defined types were placed in this encounter.   There are no Patient Instructions on file for this visit.  No follow-ups on file.    Thank you for the opportunity to care for this patient.  Please do not hesitate to contact me with questions.  Gareth Morgan, FNP [facility]

## 2022-10-07 NOTE — Patient Instructions (Addendum)
Asthma Restart montelukast 10 mg once a day to prevent cough or wheeze Begin albuterol 2 puffs once every 4 hours as needed for cough or wheeze You may use albuterol 2 puffs 5-15 minutes before activity A lab has been ordered to help [place] evaluate your asthma. We will call you when the result becomes available.  You may be eligible for biologic therapy that will decrease your asthma symptoms while you are getting allergy injections.  Allergic rhinitis Continue allergen avoidance measures directed toward pollens, pets, mold, ragweed, and cockroach as listed below We will decrease your injection for the next week Continue allergen immunotherapy per protocol and have access to an epinephrine autoinjector set Restart carbinoxamine 4 mg up to 4 times a da. Take this medication on the day before and the day of your allergen immunotherapy Begin Ryaltris nasal spray 2 sprays in each nostril twice a day as needed for nasal symptoms Consider saline nasal rinses as needed for nasal symptoms. Use this before any medicated nasal sprays for best result A referral to ENT may be necessary for further evaluation  Oral allergy syndrome Continue to avoid foods that irritate your mouth  Dermatitis Continue a twice a day moisturizing routine ContinueOpzelura to red or itchy areas up to twice a day Consider patch testing.  Patches are placed on Monday, removed in the first week  Call the clinic if this treatment plan is not working well for you.  Follow up in 1 month or sooner if needed.  Reducing Pollen Exposure The American Academy of Allergy, Asthma and Immunology suggests the following steps to reduce your exposure to pollen during allergy seasons. Do not hang sheets or clothing out to dry; pollen may collect on these items. Do not mow lawns or spend time around freshly cut grass; mowing stirs up pollen. Keep windows closed at night.  Keep car windows closed while driving. Minimize morning activities  outdoors, a time when pollen counts are usually at their highest. Stay indoors as much as possible when pollen counts or humidity is high and on windy days when pollen tends to remain in the air longer. Use air conditioning when possible.  Many air conditioners have filters that trap the pollen spores. Use a HEPA room air filter to remove pollen form the indoor air you breathe.  Control of Mold Allergen Mold and fungi can grow on a variety of surfaces provided certain temperature and moisture conditions exist.  Outdoor molds grow on plants, decaying vegetation and soil.  The major outdoor mold, Alternaria and Cladosporium, are found in very high numbers during hot and dry conditions.  Generally, a late Summer - Fall peak is seen for common outdoor fungal spores.  Rain will temporarily lower outdoor mold spore count, but counts rise rapidly when the rainy period ends.  The most important indoor molds are Aspergillus and Penicillium.  Dark, humid and poorly ventilated basements are ideal sites for mold growth.  The next most common sites of mold growth are the bathroom and the kitchen.  Outdoor [employer] Use air conditioning and keep windows closed Avoid exposure to decaying vegetation. Avoid leaf raking. Avoid grain handling. Consider wearing a face mask if working in moldy areas.  Indoor Mold Control Maintain humidity below 50%. Clean washable surfaces with 5% bleach solution. Remove sources e.g. Contaminated carpets.  Hives (urticaria)  Cetirizine (Zyrtec) 40m twice a day and famotidine (Pepcid) 20 mg twice a day. If no symptoms for 7-14 days then decrease to. Cetirizine (Zyrtec) 113m  twice a day and famotidine (Pepcid) 20 mg once a day.  If no symptoms for 7-14 days then decrease to. Cetirizine (Zyrtec) 70m twice a day.  If no symptoms for 7-14 days then decrease to. Cetirizine (Zyrtec) 167monce a day.  May use Benadryl (diphenhydramine) as needed for breakthrough hives       If  symptoms return, then step up dosage  Keep a detailed symptom journal including foods eaten, contact with allergens, medications taken, weather changes.    Control of Dog or Cat Allergen Avoidance is the best way to manage a dog or cat allergy. If you have a dog or cat and are allergic to dog or cats, consider removing the dog or cat from the home. If you have a dog or cat but don't want to find it a new home, or if your family wants a pet even though someone in the household is allergic, here are some strategies that may help keep symptoms at bay:  Keep the pet out of your bedroom and restrict it to only a few rooms. Be advised that keeping the dog or cat in only one room will not limit the allergens to that room. Don't pet, hug or kiss the dog or cat; if you do, wash your hands with soap and water. High-efficiency particulate air (HEPA) cleaners run continuously in a bedroom or living room can reduce allergen levels over time. Regular use of a high-efficiency vacuum cleaner or a central vacuum can reduce allergen levels. Giving your dog or cat a bath at least once a week can reduce airborne allergen.  Control of Cockroach Allergen Cockroach allergen has been identified as an important cause of acute attacks of asthma, especially in urban settings.  There are fifty-five species of cockroach that exist in the [place] three, the [place] produce allergen that can affect patients with Asthma.  Allergens can be obtained from fecal particles, egg casings and secretions from cockroaches.    Remove food sources. Reduce access to water. Seal access and entry points. Spray runways with 0.5-1% Diazinon or Chlorpyrifos Blow boric acid power under stoves and refrigerator. Place bait stations (hydramethylnon) at feeding sites.

## 2022-10-08 ENCOUNTER — Encounter: Payer: Self-pay | Admitting: Family Medicine

## 2022-10-08 DIAGNOSIS — J4520 Mild intermittent asthma, uncomplicated: Secondary | ICD-10-CM | POA: Insufficient documentation

## 2022-10-08 DIAGNOSIS — J309 Allergic rhinitis, unspecified: Secondary | ICD-10-CM | POA: Insufficient documentation

## 2022-10-12 ENCOUNTER — Telehealth: Payer: Self-pay

## 2022-10-12 NOTE — Telephone Encounter (Signed)
Please advise to change in medication.

## 2022-10-12 NOTE — Telephone Encounter (Signed)
I called and spoke with the patient and he stated that he has not opened the sample of Ryaltris yet to use it. He states that only other nasal spray he has used is Nasacort which he had a bad time using.

## 2022-10-12 NOTE — Telephone Encounter (Signed)
Thank you :)

## 2022-10-12 NOTE — Telephone Encounter (Signed)
PA request received via CMM for Ryaltris [id number]/ACT suspension  Key: [id number]

## 2022-10-12 NOTE — Telephone Encounter (Signed)
Can you please call this patient and ask him if he is using the Ryaltris and if he wants [place] to fill this medication? If he does, can you please ask which steroid nasal sprays he has failed? Thank you

## 2022-10-18 ENCOUNTER — Ambulatory Visit (INDEPENDENT_AMBULATORY_CARE_PROVIDER_SITE_OTHER): Payer: BC Managed Care – PPO

## 2022-10-18 DIAGNOSIS — J309 Allergic rhinitis, unspecified: Secondary | ICD-10-CM | POA: Diagnosis not present

## 2022-10-26 ENCOUNTER — Ambulatory Visit (INDEPENDENT_AMBULATORY_CARE_PROVIDER_SITE_OTHER): Payer: BC Managed Care – PPO

## 2022-10-26 DIAGNOSIS — J309 Allergic rhinitis, unspecified: Secondary | ICD-10-CM | POA: Diagnosis not present

## 2022-11-03 ENCOUNTER — Ambulatory Visit (INDEPENDENT_AMBULATORY_CARE_PROVIDER_SITE_OTHER): Payer: BC Managed Care – PPO

## 2022-11-03 ENCOUNTER — Telehealth: Payer: Self-pay | Admitting: Allergy & Immunology

## 2022-11-03 DIAGNOSIS — J309 Allergic rhinitis, unspecified: Secondary | ICD-10-CM | POA: Diagnosis not present

## 2022-11-03 NOTE — Telephone Encounter (Signed)
Disability forms for pt, have been placed in tray at the front desk.

## 2022-11-07 NOTE — Telephone Encounter (Signed)
Forms completed waiting on dr to sign

## 2022-11-09 NOTE — Telephone Encounter (Signed)
Forms faxed to dr g in [employer]

## 2022-11-09 NOTE — Telephone Encounter (Signed)
Done! Handed to [place].

## 2022-11-09 NOTE — Telephone Encounter (Signed)
Forms have been given to Dr. Ernst Bowler to sign

## 2022-11-09 NOTE — Telephone Encounter (Signed)
I tried reaching patient. I left a detailed message informing that fmla paperwork has been faxed and that a copy has been placed to be mailed to the address on file.

## 2022-11-09 NOTE — Telephone Encounter (Signed)
Forms have been faxed to [place] at [phone]. A copy of forms has been placed to bulk scanning and a copy has been placed to be mailed to the patient's address on file.

## 2022-11-10 NOTE — Telephone Encounter (Signed)
Called patient - DOB verified - advised of below notation - his copy will be at [address] - ready for p/u.  Patient verbalized understanding, no further questions.

## 2022-11-11 ENCOUNTER — Ambulatory Visit (INDEPENDENT_AMBULATORY_CARE_PROVIDER_SITE_OTHER): Payer: BC Managed Care – PPO

## 2022-11-11 DIAGNOSIS — J309 Allergic rhinitis, unspecified: Secondary | ICD-10-CM | POA: Diagnosis not present

## 2022-11-21 ENCOUNTER — Ambulatory Visit (INDEPENDENT_AMBULATORY_CARE_PROVIDER_SITE_OTHER): Payer: BC Managed Care – PPO | Admitting: *Deleted

## 2022-11-21 DIAGNOSIS — J309 Allergic rhinitis, unspecified: Secondary | ICD-10-CM

## 2022-12-05 ENCOUNTER — Ambulatory Visit (INDEPENDENT_AMBULATORY_CARE_PROVIDER_SITE_OTHER): Payer: BC Managed Care – PPO | Admitting: *Deleted

## 2022-12-05 DIAGNOSIS — J309 Allergic rhinitis, unspecified: Secondary | ICD-10-CM | POA: Diagnosis not present

## 2022-12-05 NOTE — Telephone Encounter (Signed)
Patient picked up a copy of his FMLA forms at his shot visit. I refaxed and emailed FMLA forms to [employer] claims per patient request. Patient received a call that the forms were never faxed. I did inform him we did fax the forms 3 weeks ago and he was not mad with [place]. He stated he has had issues with this company and the past.

## 2022-12-05 NOTE — Telephone Encounter (Signed)
I called the patient and he has not picked up his copy of the FMLA paperwork patient plans to come today 12/05/22.

## 2022-12-06 DIAGNOSIS — J3089 Other allergic rhinitis: Secondary | ICD-10-CM | POA: Diagnosis not present

## 2022-12-06 NOTE — Progress Notes (Signed)
VIALS EXP 12-06-23 

## 2022-12-13 ENCOUNTER — Ambulatory Visit (INDEPENDENT_AMBULATORY_CARE_PROVIDER_SITE_OTHER): Payer: BC Managed Care – PPO

## 2022-12-13 DIAGNOSIS — J309 Allergic rhinitis, unspecified: Secondary | ICD-10-CM | POA: Diagnosis not present

## 2022-12-20 ENCOUNTER — Ambulatory Visit (INDEPENDENT_AMBULATORY_CARE_PROVIDER_SITE_OTHER): Payer: BC Managed Care – PPO

## 2022-12-20 DIAGNOSIS — J309 Allergic rhinitis, unspecified: Secondary | ICD-10-CM

## 2022-12-30 ENCOUNTER — Ambulatory Visit (INDEPENDENT_AMBULATORY_CARE_PROVIDER_SITE_OTHER): Payer: BC Managed Care – PPO | Admitting: *Deleted

## 2022-12-30 DIAGNOSIS — J309 Allergic rhinitis, unspecified: Secondary | ICD-10-CM

## 2023-01-13 ENCOUNTER — Ambulatory Visit (INDEPENDENT_AMBULATORY_CARE_PROVIDER_SITE_OTHER): Payer: BC Managed Care – PPO

## 2023-01-13 DIAGNOSIS — J309 Allergic rhinitis, unspecified: Secondary | ICD-10-CM | POA: Diagnosis not present

## 2023-01-19 ENCOUNTER — Ambulatory Visit (INDEPENDENT_AMBULATORY_CARE_PROVIDER_SITE_OTHER): Payer: BC Managed Care – PPO

## 2023-01-19 DIAGNOSIS — J309 Allergic rhinitis, unspecified: Secondary | ICD-10-CM

## 2023-01-25 ENCOUNTER — Ambulatory Visit (INDEPENDENT_AMBULATORY_CARE_PROVIDER_SITE_OTHER): Payer: BC Managed Care – PPO | Admitting: *Deleted

## 2023-01-25 DIAGNOSIS — J309 Allergic rhinitis, unspecified: Secondary | ICD-10-CM | POA: Diagnosis not present

## 2023-01-26 DIAGNOSIS — M542 Cervicalgia: Secondary | ICD-10-CM | POA: Diagnosis not present

## 2023-01-31 ENCOUNTER — Ambulatory Visit (INDEPENDENT_AMBULATORY_CARE_PROVIDER_SITE_OTHER): Payer: BC Managed Care – PPO

## 2023-01-31 DIAGNOSIS — J309 Allergic rhinitis, unspecified: Secondary | ICD-10-CM | POA: Diagnosis not present

## 2023-02-08 ENCOUNTER — Ambulatory Visit (INDEPENDENT_AMBULATORY_CARE_PROVIDER_SITE_OTHER): Payer: BC Managed Care – PPO

## 2023-02-08 DIAGNOSIS — J309 Allergic rhinitis, unspecified: Secondary | ICD-10-CM | POA: Diagnosis not present

## 2023-02-09 DIAGNOSIS — M542 Cervicalgia: Secondary | ICD-10-CM | POA: Diagnosis not present

## 2023-02-16 ENCOUNTER — Ambulatory Visit (INDEPENDENT_AMBULATORY_CARE_PROVIDER_SITE_OTHER): Payer: BC Managed Care – PPO

## 2023-02-16 DIAGNOSIS — J309 Allergic rhinitis, unspecified: Secondary | ICD-10-CM | POA: Diagnosis not present

## 2023-02-28 ENCOUNTER — Ambulatory Visit: Payer: Self-pay | Admitting: *Deleted

## 2023-02-28 DIAGNOSIS — J309 Allergic rhinitis, unspecified: Secondary | ICD-10-CM | POA: Diagnosis not present

## 2023-03-10 ENCOUNTER — Ambulatory Visit (INDEPENDENT_AMBULATORY_CARE_PROVIDER_SITE_OTHER): Payer: BC Managed Care – PPO | Admitting: *Deleted

## 2023-03-10 DIAGNOSIS — J309 Allergic rhinitis, unspecified: Secondary | ICD-10-CM

## 2023-03-20 ENCOUNTER — Other Ambulatory Visit: Payer: Self-pay | Admitting: Emergency Medicine

## 2023-03-20 DIAGNOSIS — E039 Hypothyroidism, unspecified: Secondary | ICD-10-CM

## 2023-03-27 ENCOUNTER — Ambulatory Visit (INDEPENDENT_AMBULATORY_CARE_PROVIDER_SITE_OTHER): Payer: BC Managed Care – PPO

## 2023-03-27 DIAGNOSIS — J309 Allergic rhinitis, unspecified: Secondary | ICD-10-CM

## 2023-03-28 ENCOUNTER — Ambulatory Visit (INDEPENDENT_AMBULATORY_CARE_PROVIDER_SITE_OTHER): Payer: BC Managed Care – PPO | Admitting: Emergency Medicine

## 2023-03-28 ENCOUNTER — Encounter: Payer: Self-pay | Admitting: Emergency Medicine

## 2023-03-28 VITALS — BP 126/84 | HR 60 | Temp 98.1°F | Ht 74.0 in | Wt 276.0 lb

## 2023-03-28 DIAGNOSIS — Z13228 Encounter for screening for other metabolic disorders: Secondary | ICD-10-CM

## 2023-03-28 DIAGNOSIS — Z130 Encounter for screening for diseases of the blood and blood-forming organs and certain disorders involving the immune mechanism: Secondary | ICD-10-CM

## 2023-03-28 DIAGNOSIS — Z13220 Encounter for screening for lipoid disorders: Secondary | ICD-10-CM

## 2023-03-28 DIAGNOSIS — E039 Hypothyroidism, unspecified: Secondary | ICD-10-CM

## 2023-03-28 DIAGNOSIS — Z0000 Encounter for general adult medical examination without abnormal findings: Secondary | ICD-10-CM

## 2023-03-28 DIAGNOSIS — Z1329 Encounter for screening for other suspected endocrine disorder: Secondary | ICD-10-CM | POA: Diagnosis not present

## 2023-03-28 NOTE — Patient Instructions (Signed)
Health Maintenance, Male Adopting a healthy lifestyle and getting preventive care are important in promoting health and wellness. Ask your health care provider about: The right schedule for you to have regular tests and exams. Things you can do on your own to prevent diseases and keep yourself healthy. What should I know about diet, weight, and exercise? Eat a healthy diet  Eat a diet that includes plenty of vegetables, fruits, low-fat dairy products, and lean protein. Do not eat a lot of foods that are high in solid fats, added sugars, or sodium. Maintain a healthy weight Body mass index (BMI) is a measurement that can be used to identify possible weight problems. It estimates body fat based on height and weight. Your health care provider can help determine your BMI and help you achieve or maintain a healthy weight. Get regular exercise Get regular exercise. This is one of the most important things you can do for your health. Most adults should: Exercise for at least 150 minutes each week. The exercise should increase your heart rate and make you sweat (moderate-intensity exercise). Do strengthening exercises at least twice a week. This is in addition to the moderate-intensity exercise. Spend less time sitting. Even light physical activity can be beneficial. Watch cholesterol and blood lipids Have your blood tested for lipids and cholesterol at 33 years of age, then have this test every 5 years. You may need to have your cholesterol levels checked more often if: Your lipid or cholesterol levels are high. You are older than 33 years of age. You are at high risk for heart disease. What should I know about cancer screening? Many types of cancers can be detected early and may often be prevented. Depending on your health history and family history, you may need to have cancer screening at various ages. This may include screening for: Colorectal cancer. Prostate cancer. Skin cancer. Lung  cancer. What should I know about heart disease, diabetes, and high blood pressure? Blood pressure and heart disease High blood pressure causes heart disease and increases the risk of stroke. This is more likely to develop in people who have high blood pressure readings or are overweight. Talk with your health care provider about your target blood pressure readings. Have your blood pressure checked: Every 3-5 years if you are 18-39 years of age. Every year if you are 40 years old or older. If you are between the ages of 65 and 75 and are a current or former smoker, ask your health care provider if you should have a one-time screening for abdominal aortic aneurysm (AAA). Diabetes Have regular diabetes screenings. This checks your fasting blood sugar level. Have the screening done: Once every three years after age 45 if you are at a normal weight and have a low risk for diabetes. More often and at a younger age if you are overweight or have a high risk for diabetes. What should I know about preventing infection? Hepatitis B If you have a higher risk for hepatitis B, you should be screened for this virus. Talk with your health care provider to find out if you are at risk for hepatitis B infection. Hepatitis C Blood testing is recommended for: Everyone born from 1945 through 1965. Anyone with known risk factors for hepatitis C. Sexually transmitted infections (STIs) You should be screened each year for STIs, including gonorrhea and chlamydia, if: You are sexually active and are younger than 33 years of age. You are older than 33 years of age and your   health care provider tells you that you are at risk for this type of infection. Your sexual activity has changed since you were last screened, and you are at increased risk for chlamydia or gonorrhea. Ask your health care provider if you are at risk. Ask your health care provider about whether you are at high risk for HIV. Your health care provider  may recommend a prescription medicine to help prevent HIV infection. If you choose to take medicine to prevent HIV, you should first get tested for HIV. You should then be tested every 3 months for as long as you are taking the medicine. Follow these instructions at home: Alcohol use Do not drink alcohol if your health care provider tells you not to drink. If you drink alcohol: Limit how much you have to 0-2 drinks a day. Know how much alcohol is in your drink. In the [place] one drink equals one 12 oz bottle of beer (355 mL), one 5 oz glass of wine (148 mL), or one 1 oz glass of hard liquor (44 mL). Lifestyle Do not use any products that contain nicotine or tobacco. These products include cigarettes, chewing tobacco, and vaping devices, such as e-cigarettes. If you need help quitting, ask your health care provider. Do not use street drugs. Do not share needles. Ask your health care provider for help if you need support or information about quitting drugs. General instructions Schedule regular health, dental, and eye exams. Stay current with your vaccines. Tell your health care provider if: You often feel depressed. You have ever been abused or do not feel safe at home. Summary Adopting a healthy lifestyle and getting preventive care are important in promoting health and wellness. Follow your health care provider's instructions about healthy diet, exercising, and getting tested or screened for diseases. Follow your health care provider's instructions on monitoring your cholesterol and blood pressure. This information is not intended to replace advice given to you by your health care provider. Make sure you discuss any questions you have with your health care provider. Document Revised: 12/28/2020 Document Reviewed: 12/28/2020 Elsevier Patient Education  2024 Elsevier Inc.  

## 2023-03-28 NOTE — Progress Notes (Signed)
Brian Webster 32 y.o.   Chief Complaint  Patient presents with   Annual Exam    No concerns     HISTORY OF PRESENT ILLNESS: This is a 32 y.o. male here for annual exam. History of hypothyroidism. Healthy male with a healthy lifestyle Has no complaints or medical concerns today.   HPI   Prior to Admission medications   Medication Sig Start Date End Date Taking? Authorizing Provider  levothyroxine (SYNTHROID) 100 MCG tablet TAKE 1 TABLET(100 MCG) BY MOUTH DAILY 03/20/23  Yes Treva Huyett, Eilleen Kempf, MD  Multiple Vitamin (MULTIVITAMIN WITH MINERALS) TABS tablet Take 1 tablet by mouth daily.   Yes [provider]  Olopatadine-Mometasone (RYALTRIS) [phone] MCG/ACT SUSP Place 2 sprays into the nose 2 (two) times daily as needed. 10/07/22  Yes Ambs, Norvel Richards, FNP  albuterol (VENTOLIN HFA) 108 (90 Base) MCG/ACT inhaler Inhale 2 puffs into the lungs every 4 (four) hours as needed for wheezing or shortness of breath. Patient not taking: Reported on 03/28/2023 10/07/22   Hetty Blend, FNP  EPINEPHrine (EPIPEN 2-PAK) 0.3 mg/0.3 mL IJ SOAJ injection Inject 0.3 mg into the muscle as needed for anaphylaxis. Patient not taking: Reported on 03/28/2023 10/07/22   Hetty Blend, FNP  Fexofenadine HCl [facility] ALLERGY PO) Take by mouth as needed. Patient not taking: Reported on 03/28/2023    [provider]  montelukast (SINGULAIR) 10 MG tablet Take 1 tablet (10 mg total) by mouth at bedtime. 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    Past Medical History:  Diagnosis Date   Asthma    Recurrent upper respiratory infection (URI)     Past Surgical History:  Procedure Laterality Date   TONSILECTOMY, ADENOIDECTOMY, BILATERAL MYRINGOTOMY AND TUBES      Social History   Socioeconomic History   Marital status: Single    Spouse name: Not on file   Number of children: Not on file   Years of education: Not on file   Highest education level: Not on file  Occupational History   Not on file  Tobacco Use   Smoking status: Never    Passive exposure: Never   Smokeless tobacco: Never  Vaping Use   Vaping status: Never Used  Substance and Sexual Activity   Alcohol use: Yes   Drug use: No   Sexual activity: Yes  Other Topics Concern   Not on file  Social History Narrative   Not on file   Social Determinants of Health   Financial Resource Strain: Not on file  Food Insecurity: Not on file  Transportation Needs: Not on file  Physical Activity: Not on file  Stress: Not on file  Social Connections: Not on file  Intimate Partner Violence: Not on file    Family History  Problem Relation Age of Onset   Heart disease Mother    Hypertension Mother    Allergic rhinitis Brother    Allergic rhinitis Son      Review of Systems  Constitutional: Negative.  Negative for chills and fever.  HENT: Negative.  Negative for congestion and sore throat.   Respiratory: Negative.  Negative for cough and shortness of breath.   Cardiovascular: Negative.  Negative for chest pain and palpitations.  Gastrointestinal:  Negative for abdominal pain, diarrhea, nausea and vomiting.  Genitourinary: Negative.  Negative for dysuria and hematuria.  Skin: Negative.  Negative for rash.  Neurological: Negative.  Negative for dizziness and headaches.  All other systems reviewed and are negative.   Vitals:   03/28/23 0824  BP: 126/84  Pulse: 60  Temp: 98.1 F (36.7 C)  SpO2:  100%    Physical Exam Vitals reviewed.  Constitutional:      Appearance: Normal appearance.  HENT:     Head: Normocephalic.     Right Ear: Tympanic membrane, ear canal and external ear normal.     Left Ear: Tympanic membrane, ear canal and external ear normal.     Mouth/Throat:     Mouth: Mucous membranes are moist.     Pharynx: Oropharynx is clear.  Eyes:     Extraocular Movements: Extraocular movements intact.     Conjunctiva/sclera: Conjunctivae normal.     Pupils: Pupils are equal, round, and reactive to light.  Cardiovascular:     Rate and Rhythm: Normal rate and regular rhythm.     Pulses: Normal pulses.     Heart sounds: Normal heart sounds.  Pulmonary:     Effort: Pulmonary effort is normal.     Breath sounds: Normal breath sounds.  Abdominal:     Palpations: Abdomen is soft.     Tenderness: There is no abdominal tenderness.  Musculoskeletal:     Cervical back: No tenderness.  Lymphadenopathy:     Cervical: No cervical adenopathy.  Skin:    General: Skin is warm and dry.     Capillary Refill: Capillary refill takes less than 2 seconds.  Neurological:     General: No focal deficit present.     Mental Status: He is alert and oriented to person, place, and time.  Psychiatric:        Mood and Affect: Mood normal.        Behavior: Behavior normal.      ASSESSMENT & PLAN: Problem List Items Addressed This Visit       Endocrine   Hypothyroidism   Relevant Orders   TSH   Other Visit Diagnoses     Routine general medical examination at a health care facility    -  Primary   Relevant Orders   CBC with Differential/Platelet   Comprehensive metabolic panel   Lipid panel   TSH   Hemoglobin A1c   Screening for deficiency anemia       Relevant Orders   CBC with Differential/Platelet   Screening for lipoid disorders       Relevant Orders   Lipid panel   Screening for endocrine, metabolic and immunity disorder       Relevant Orders   Comprehensive  metabolic panel   Hemoglobin A1c     Modifiable risk factors discussed with patient. Anticipatory guidance according to age provided. The following topics were also discussed: Social Determinants of Health Smoking.  Non-smoker Diet and nutrition Benefits of exercise Cancer family history review Vaccinations review and recommendations Cardiovascular risk assessment need for blood work today Mental health including depression and anxiety Fall and accident prevention  Patient Instructions  Health Maintenance, Male Adopting a healthy lifestyle and getting preventive care are important in promoting  health and wellness. Ask your health care provider about: The right schedule for you to have regular tests and exams. Things you can do on your own to prevent diseases and keep yourself healthy. What should I know about diet, weight, and exercise? Eat a healthy diet  Eat a diet that includes plenty of vegetables, fruits, low-fat dairy products, and lean protein. Do not eat a lot of foods that are high in solid fats, added sugars, or sodium. Maintain a healthy weight Body mass index (BMI) is a measurement that can be used to identify possible weight problems. It estimates body fat based on height and weight. Your health care provider can help determine your BMI and help you achieve or maintain a healthy weight. Get regular exercise Get regular exercise. This is one of the most important things you can do for your health. Most adults should: Exercise for at least 150 minutes each week. The exercise should increase your heart rate and make you sweat (moderate-intensity exercise). Do strengthening exercises at least twice a week. This is in addition to the moderate-intensity exercise. Spend less time sitting. Even light physical activity can be beneficial. Watch cholesterol and blood lipids Have your blood tested for lipids and cholesterol at 33 years of age, then have this test every 5  years. You may need to have your cholesterol levels checked more often if: Your lipid or cholesterol levels are high. You are older than 33 years of age. You are at high risk for heart disease. What should I know about cancer screening? Many types of cancers can be detected early and may often be prevented. Depending on your health history and family history, you may need to have cancer screening at various ages. This may include screening for: Colorectal cancer. Prostate cancer. Skin cancer. Lung cancer. What should I know about heart disease, diabetes, and high blood pressure? Blood pressure and heart disease High blood pressure causes heart disease and increases the risk of stroke. This is more likely to develop in people who have high blood pressure readings or are overweight. Talk with your health care provider about your target blood pressure readings. Have your blood pressure checked: Every 3-5 years if you are 19-18 years of age. Every year if you are 35 years old or older. If you are between the ages of 28 and 9 and are a current or former smoker, ask your health care provider if you should have a one-time screening for abdominal aortic aneurysm (AAA). Diabetes Have regular diabetes screenings. This checks your fasting blood sugar level. Have the screening done: Once every three years after age 56 if you are at a normal weight and have a low risk for diabetes. More often and at a younger age if you are overweight or have a high risk for diabetes. What should I know about preventing infection? Hepatitis B If you have a higher risk for hepatitis B, you should be screened for this virus. Talk with your health care provider to find out if you are at risk for hepatitis B infection. Hepatitis C Blood testing is recommended for: Everyone born from [birth year] through 1965. Anyone with known risk factors for hepatitis C. Sexually transmitted infections (STIs) You should be screened each  year for STIs, including gonorrhea and chlamydia, if: You are sexually active and are younger than 33 years of age. You are older than 32 years of age and your health care provider tells you that you are at risk for this type of  infection. Your sexual activity has changed since you were last screened, and you are at increased risk for chlamydia or gonorrhea. Ask your health care provider if you are at risk. Ask your health care provider about whether you are at high risk for HIV. Your health care provider may recommend a prescription medicine to help prevent HIV infection. If you choose to take medicine to prevent HIV, you should first get tested for HIV. You should then be tested every 3 months for as long as you are taking the medicine. Follow these instructions at home: Alcohol use Do not drink alcohol if your health care provider tells you not to drink. If you drink alcohol: Limit how much you have to 0-2 drinks a day. Know how much alcohol is in your drink. In the [place] one drink equals one 12 oz bottle of beer (355 mL), one 5 oz glass of wine (148 mL), or one 1 oz glass of hard liquor (44 mL). Lifestyle Do not use any products that contain nicotine or tobacco. These products include cigarettes, chewing tobacco, and vaping devices, such as e-cigarettes. If you need help quitting, ask your health care provider. Do not use street drugs. Do not share needles. Ask your health care provider for help if you need support or information about quitting drugs. General instructions Schedule regular health, dental, and eye exams. Stay current with your vaccines. Tell your health care provider if: You often feel depressed. You have ever been abused or do not feel safe at home. Summary Adopting a healthy lifestyle and getting preventive care are important in promoting health and wellness. Follow your health care provider's instructions about healthy diet, exercising, and getting tested or screened  for diseases. Follow your health care provider's instructions on monitoring your cholesterol and blood pressure. This information is not intended to replace advice given to you by your health care provider. Make sure you discuss any questions you have with your health care provider. Document Revised: 12/28/2020 Document Reviewed: 12/28/2020 Elsevier Patient Education  2024 Elsevier Inc.       Edwina Barth, MD [facility]

## 2023-04-03 LAB — COMPREHENSIVE METABOLIC PANEL
ALT: 25 U/L (ref 0–53)
AST: 20 U/L (ref 0–37)
Albumin: 4.6 g/dL (ref 3.5–5.2)
Alkaline Phosphatase: 37 U/L — ABNORMAL LOW (ref 39–117)
BUN: 14 mg/dL (ref 6–23)
CO2: 27 mEq/L (ref 19–32)
Calcium: 9.8 mg/dL (ref 8.4–10.5)
Chloride: 102 mEq/L (ref 96–112)
Creatinine, Ser: 0.99 mg/dL (ref 0.40–1.50)
GFR: 100.07 mL/min (ref >60.00)
Glucose, Bld: 90 mg/dL (ref 70–99)
Potassium: 3.8 mEq/L (ref 3.5–5.1)
Sodium: 137 mEq/L (ref 135–145)
Total Bilirubin: 1.1 mg/dL (ref 0.2–1.2)
Total Protein: 7.7 g/dL (ref 6.0–8.3)

## 2023-04-03 LAB — CBC WITH DIFFERENTIAL/PLATELET
Basophils Absolute: 0 10*3/uL (ref 0.0–0.1)
Basophils Relative: 1 % (ref 0.0–3.0)
Eosinophils Absolute: 0 10*3/uL (ref 0.0–0.7)
Eosinophils Relative: 0.5 % (ref 0.0–5.0)
HCT: 43.6 % (ref 39.0–52.0)
Hemoglobin: 14.4 g/dL (ref 13.0–17.0)
Lymphocytes Relative: 47.6 % — ABNORMAL HIGH (ref 12.0–46.0)
Lymphs Abs: 2.1 10*3/uL (ref 0.7–4.0)
MCHC: 33 g/dL (ref 30.0–36.0)
MCV: 84.5 fl (ref 78.0–100.0)
Monocytes Absolute: 0.3 10*3/uL (ref 0.1–1.0)
Monocytes Relative: 5.8 % (ref 3.0–12.0)
Neutro Abs: 2 10*3/uL (ref 1.4–7.7)
Neutrophils Relative %: 45.1 % (ref 43.0–77.0)
Platelets: 199 10*3/uL (ref 150.0–400.0)
RBC: 5.16 Mil/uL (ref 4.22–5.81)
RDW: 13.8 % (ref 11.5–15.5)
WBC: 4.5 10*3/uL (ref 4.0–10.5)

## 2023-04-03 LAB — TSH: TSH: 5.63 u[IU]/mL — ABNORMAL HIGH (ref 0.35–5.50)

## 2023-04-03 LAB — LIPID PANEL
Cholesterol: 262 mg/dL — ABNORMAL HIGH (ref 0–200)
HDL: 44.5 mg/dL (ref >39.00)
LDL Cholesterol: 195 mg/dL — ABNORMAL HIGH (ref 0–99)
NonHDL: 217.82
Total CHOL/HDL Ratio: 6
Triglycerides: 116 mg/dL (ref 0.0–149.0)
VLDL: 23.2 mg/dL (ref 0.0–40.0)

## 2023-04-03 LAB — HEMOGLOBIN A1C: Hgb A1c MFr Bld: 5.8 % (ref 4.6–6.5)

## 2023-04-07 ENCOUNTER — Ambulatory Visit (INDEPENDENT_AMBULATORY_CARE_PROVIDER_SITE_OTHER): Payer: BC Managed Care – PPO

## 2023-04-07 DIAGNOSIS — J309 Allergic rhinitis, unspecified: Secondary | ICD-10-CM

## 2023-04-13 ENCOUNTER — Ambulatory Visit: Payer: BC Managed Care – PPO | Admitting: Emergency Medicine

## 2023-04-13 ENCOUNTER — Encounter: Payer: BC Managed Care – PPO | Admitting: Emergency Medicine

## 2023-04-14 ENCOUNTER — Ambulatory Visit (INDEPENDENT_AMBULATORY_CARE_PROVIDER_SITE_OTHER): Payer: BC Managed Care – PPO | Admitting: *Deleted

## 2023-04-14 DIAGNOSIS — J309 Allergic rhinitis, unspecified: Secondary | ICD-10-CM

## 2023-04-20 DIAGNOSIS — J3089 Other allergic rhinitis: Secondary | ICD-10-CM | POA: Diagnosis not present

## 2023-04-20 NOTE — Progress Notes (Signed)
VIALS EXP 04-19-24

## 2023-04-25 ENCOUNTER — Ambulatory Visit (INDEPENDENT_AMBULATORY_CARE_PROVIDER_SITE_OTHER): Payer: BC Managed Care – PPO | Admitting: *Deleted

## 2023-04-25 DIAGNOSIS — J309 Allergic rhinitis, unspecified: Secondary | ICD-10-CM | POA: Diagnosis not present

## 2023-04-26 ENCOUNTER — Telehealth: Payer: BC Managed Care – PPO | Admitting: Emergency Medicine

## 2023-04-26 ENCOUNTER — Encounter: Payer: Self-pay | Admitting: Emergency Medicine

## 2023-05-10 ENCOUNTER — Ambulatory Visit (INDEPENDENT_AMBULATORY_CARE_PROVIDER_SITE_OTHER): Payer: Self-pay | Admitting: *Deleted

## 2023-05-10 DIAGNOSIS — J309 Allergic rhinitis, unspecified: Secondary | ICD-10-CM

## 2023-06-02 ENCOUNTER — Ambulatory Visit (INDEPENDENT_AMBULATORY_CARE_PROVIDER_SITE_OTHER): Payer: BC Managed Care – PPO | Admitting: *Deleted

## 2023-06-02 DIAGNOSIS — J309 Allergic rhinitis, unspecified: Secondary | ICD-10-CM

## 2023-06-09 ENCOUNTER — Ambulatory Visit (INDEPENDENT_AMBULATORY_CARE_PROVIDER_SITE_OTHER): Payer: BC Managed Care – PPO

## 2023-06-09 DIAGNOSIS — J309 Allergic rhinitis, unspecified: Secondary | ICD-10-CM | POA: Diagnosis not present

## 2023-06-27 ENCOUNTER — Ambulatory Visit (INDEPENDENT_AMBULATORY_CARE_PROVIDER_SITE_OTHER): Payer: BC Managed Care – PPO | Admitting: *Deleted

## 2023-06-27 DIAGNOSIS — J309 Allergic rhinitis, unspecified: Secondary | ICD-10-CM | POA: Diagnosis not present

## 2023-07-07 ENCOUNTER — Ambulatory Visit (INDEPENDENT_AMBULATORY_CARE_PROVIDER_SITE_OTHER): Payer: BC Managed Care – PPO | Admitting: *Deleted

## 2023-07-07 DIAGNOSIS — J309 Allergic rhinitis, unspecified: Secondary | ICD-10-CM | POA: Diagnosis not present

## 2023-07-17 ENCOUNTER — Ambulatory Visit (INDEPENDENT_AMBULATORY_CARE_PROVIDER_SITE_OTHER): Payer: Self-pay | Admitting: *Deleted

## 2023-07-17 DIAGNOSIS — J309 Allergic rhinitis, unspecified: Secondary | ICD-10-CM

## 2023-07-31 ENCOUNTER — Ambulatory Visit (INDEPENDENT_AMBULATORY_CARE_PROVIDER_SITE_OTHER): Payer: BC Managed Care – PPO

## 2023-07-31 DIAGNOSIS — J309 Allergic rhinitis, unspecified: Secondary | ICD-10-CM | POA: Diagnosis not present

## 2023-08-10 ENCOUNTER — Ambulatory Visit (INDEPENDENT_AMBULATORY_CARE_PROVIDER_SITE_OTHER): Payer: BC Managed Care – PPO

## 2023-08-10 DIAGNOSIS — J309 Allergic rhinitis, unspecified: Secondary | ICD-10-CM | POA: Diagnosis not present

## 2023-08-15 ENCOUNTER — Ambulatory Visit (INDEPENDENT_AMBULATORY_CARE_PROVIDER_SITE_OTHER): Payer: BC Managed Care – PPO | Admitting: *Deleted

## 2023-08-15 DIAGNOSIS — J309 Allergic rhinitis, unspecified: Secondary | ICD-10-CM | POA: Diagnosis not present

## 2023-09-20 ENCOUNTER — Ambulatory Visit (INDEPENDENT_AMBULATORY_CARE_PROVIDER_SITE_OTHER): Payer: Self-pay

## 2023-09-20 DIAGNOSIS — J309 Allergic rhinitis, unspecified: Secondary | ICD-10-CM | POA: Diagnosis not present

## 2023-10-25 ENCOUNTER — Ambulatory Visit (INDEPENDENT_AMBULATORY_CARE_PROVIDER_SITE_OTHER): Payer: Self-pay | Admitting: *Deleted

## 2023-10-25 DIAGNOSIS — J309 Allergic rhinitis, unspecified: Secondary | ICD-10-CM

## 2023-11-22 ENCOUNTER — Ambulatory Visit (INDEPENDENT_AMBULATORY_CARE_PROVIDER_SITE_OTHER): Payer: Self-pay | Admitting: *Deleted

## 2023-11-22 DIAGNOSIS — J309 Allergic rhinitis, unspecified: Secondary | ICD-10-CM

## 2023-12-26 ENCOUNTER — Ambulatory Visit (INDEPENDENT_AMBULATORY_CARE_PROVIDER_SITE_OTHER): Payer: Self-pay

## 2023-12-26 DIAGNOSIS — J309 Allergic rhinitis, unspecified: Secondary | ICD-10-CM

## 2024-01-08 NOTE — Patient Instructions (Incomplete)
 Asthma Continue montelukast  10 mg once a day to prevent cough or wheeze May use albuterol  2 puffs once every 4 hours as needed for cough or wheeze You may use albuterol  2 puffs 5-15 minutes before activity  Asthma control goals:  Full participation in all desired activities (may need albuterol  before activity) Albuterol  use two time or less a week on average (not counting use with activity) Cough interfering with sleep two time or less a month Oral steroids no more than once a year No hospitalizations  Allergic rhinitis Continue allergen avoidance measures directed toward pollens, pets, mold, ragweed, and cockroach as listed below We will decrease your injection for the next week Continue allergen immunotherapy per protocol and have access to an epinephrine  autoinjector set Continue carbinoxamine  4 mg up to 4 times a day. Take this medication on the day before and the day of your allergen immunotherapy Continue Ryaltris  nasal spray 2 sprays in each nostril twice a day as needed for nasal symptoms Consider saline nasal rinses as needed for nasal symptoms. Use this before any medicated nasal sprays for best result A referral to ENT may be necessary for further evaluation  Oral allergy syndrome Continue to avoid foods that irritate your mouth  Dermatitis Continue a twice a day moisturizing routine ContinueOpzelura to red or itchy areas up to twice a day Consider patch testing.  Patches are placed on Monday, removed in the first week  Call the clinic if this treatment plan is not working well for you.  Follow up in  month or sooner if needed.  Reducing Pollen Exposure The American Academy of Allergy, Asthma and Immunology suggests the following steps to reduce your exposure to pollen during allergy seasons. Do not hang sheets or clothing out to dry; pollen may collect on these items. Do not mow lawns or spend time around freshly cut grass; mowing stirs up pollen. Keep windows closed  at night.  Keep car windows closed while driving. Minimize morning activities outdoors, a time when pollen counts are usually at their highest. Stay indoors as much as possible when pollen counts or humidity is high and on windy days when pollen tends to remain in the air longer. Use air conditioning when possible.  Many air conditioners have filters that trap the pollen spores. Use a HEPA room air filter to remove pollen form the indoor air you breathe.  Control of Mold Allergen Mold and fungi can grow on a variety of surfaces provided certain temperature and moisture conditions exist.  Outdoor molds grow on plants, decaying vegetation and soil.  The major outdoor mold, Alternaria and Cladosporium, are found in very high numbers during hot and dry conditions.  Generally, a late Summer - Fall peak is seen for common outdoor fungal spores.  Rain will temporarily lower outdoor mold spore count, but counts rise rapidly when the rainy period ends.  The most important indoor molds are Aspergillus and Penicillium.  Dark, humid and poorly ventilated basements are ideal sites for mold growth.  The next most common sites of mold growth are the bathroom and the kitchen.  Outdoor [employer] Use air conditioning and keep windows closed Avoid exposure to decaying vegetation. Avoid leaf raking. Avoid grain handling. Consider wearing a face mask if working in moldy areas.  Indoor Mold Control Maintain humidity below 50%. Clean washable surfaces with 5% bleach solution. Remove sources e.g. Contaminated carpets.  Hives (urticaria)  Cetirizine (Zyrtec) 10mg  twice a day and famotidine (Pepcid) 20 mg twice a day.  If no symptoms for 7-14 days then decrease to. Cetirizine (Zyrtec) 10mg  twice a day and famotidine (Pepcid) 20 mg once a day.  If no symptoms for 7-14 days then decrease to. Cetirizine (Zyrtec) 10mg  twice a day.  If no symptoms for 7-14 days then decrease to. Cetirizine (Zyrtec) 10mg  once a  day.  May use Benadryl (diphenhydramine) as needed for breakthrough hives       If symptoms return, then step up dosage  Keep a detailed symptom journal including foods eaten, contact with allergens, medications taken, weather changes.    Control of Dog or Cat Allergen Avoidance is the best way to manage a dog or cat allergy. If you have a dog or cat and are allergic to dog or cats, consider removing the dog or cat from the home. If you have a dog or cat but don't want to find it a new home, or if your family wants a pet even though someone in the household is allergic, here are some strategies that may help keep symptoms at bay:  Keep the pet out of your bedroom and restrict it to only a few rooms. Be advised that keeping the dog or cat in only one room will not limit the allergens to that room. Don't pet, hug or kiss the dog or cat; if you do, wash your hands with soap and water. High-efficiency particulate air (HEPA) cleaners run continuously in a bedroom or living room can reduce allergen levels over time. Regular use of a high-efficiency vacuum cleaner or a central vacuum can reduce allergen levels. Giving your dog or cat a bath at least once a week can reduce airborne allergen.  Control of Cockroach Allergen Cockroach allergen has been identified as an important cause of acute attacks of asthma, especially in urban settings.  There are fifty-five species of cockroach that exist in the [place] , however only three, the [place], [place] and [place] species produce allergen that can affect patients with Asthma.  Allergens can be obtained from fecal particles, egg casings and secretions from cockroaches.    Remove food sources. Reduce access to water. Seal access and entry points. Spray runways with 0.5-1% Diazinon or Chlorpyrifos Blow boric acid power under stoves and refrigerator. Place bait stations (hydramethylnon) at feeding sites.

## 2024-01-09 ENCOUNTER — Ambulatory Visit: Admitting: Family

## 2024-01-09 DIAGNOSIS — J309 Allergic rhinitis, unspecified: Secondary | ICD-10-CM

## 2024-01-10 NOTE — Progress Notes (Signed)
 [address] [place] [address] Dept: [phone]  FOLLOW UP NOTE  Patient ID: Brian Webster, male    DOB: 1991/02/22  Age: 33 y.o. MRN: [id number] Date of Office Visit: 01/11/2024  Assessment  Chief Complaint: Allergic Rhinitis  and Follow-up  HPI Brian Webster is a 33 year old male who presents to the clinic for  a follow-up visit.  He was last seen in this clinic on 10/07/2022 by Marinus Sic, FNP, for evaluation of asthma, allergic rhinitis on allergen immunotherapy, atopic dermatitis, oral allergy syndrome, and possible allergic contact dermatitis.  At today's visit comes he reports his asthma has been moderately well-controlled with occasional cough.  He denies shortness of breath or wheeze with activity or rest.  He continues albuterol  as needed with relief of symptoms.  Allergic rhinitis is reported as moderately well-controlled with symptoms including rhinorrhea, nasal congestion, and sneezing.  He continues Allegra about 3 days a week and reports that he is unable to use nasal saline rinses or nasal steroid spray. He began allergen immunotherapy directed toward grass pollen, weed pollen, tree pollen, cat, dog, mold, ragweed, cockroach, and dust mite on 01/04/2022.   He reports episodes of epistaxis occurring from either nostril about 2 times a week with bleeding frequently lasting longer than 5 minutes. He reports using ice and pinches the bridge of his nose to stop bleeding. He has seen an ENT specialist previously, however, not for several years.   Atopic dermitis is reported as moderately well controlled with dry and red patches occurring mainly on his ankles for which he uses a moisturizing routine. He reports that he frequently gets a rash under both arms. He reports that he wears a gel deodorant during the day and showers and washes the deodorant off at that time. We discussed chemical patch testing in detail.   He reports that he can now consume most fruits without  experiencing mouth or throat tingling.   His current medications are listed in the chart.  Drug Allergies:  No Known Allergies  Physical Exam: BP 118/74 (BP Location: Left Arm, Patient Position: Sitting, Cuff Size: Large)   Pulse 68   Temp 98.6 F (37 C) (Temporal)   Wt 288 lb 6.4 oz (130.8 kg)   SpO2 97%   BMI 37.03 kg/m    Physical Exam Vitals reviewed.  Constitutional:      Appearance: Normal appearance.  HENT:     Head: Normocephalic and atraumatic.     Right Ear: Tympanic membrane normal.     Left Ear: Tympanic membrane normal.     Nose:     Comments: Bilateral nares slightly erythematous with thin clear nasal drainage noted. Pharynx normal. Ears normal.    Mouth/Throat:     Pharynx: Oropharynx is clear.  Eyes:     Conjunctiva/sclera: Conjunctivae normal.  Cardiovascular:     Rate and Rhythm: Normal rate and regular rhythm.     Heart sounds: Normal heart sounds. No murmur heard. Pulmonary:     Effort: Pulmonary effort is normal.     Breath sounds: Normal breath sounds.     Comments: Lungs clear to auscultation Musculoskeletal:        General: Normal range of motion.     Cervical back: Normal range of motion and neck supple.  Skin:    General: Skin is warm and dry.  Neurological:     Mental Status: He is alert and oriented to person, place, and time.  Psychiatric:  Mood and Affect: Mood normal.        Behavior: Behavior normal.        Thought Content: Thought content normal.        Judgment: Judgment normal.    Assessment and Plan: 1. Mild intermittent asthma without complication   2. Seasonal and perennial allergic rhinitis   3. Pollen-food allergy syndrome, subsequent encounter   4. Intrinsic atopic dermatitis   5. Allergic dermatitis due to other chemical product   6. Epistaxis     Meds ordered this encounter  Medications   triamcinolone ointment (KENALOG) 0.1 %    Sig: Apply 1 Application topically 2 (two) times daily.    Dispense:  30 g     Refill:  3   EPINEPHrine  (EPIPEN  2-PAK) 0.3 mg/0.3 mL IJ SOAJ injection    Sig: Inject 0.3 mg into the muscle as needed for anaphylaxis.    Dispense:  0.3 mL    Refill:  1    Patient Instructions  Asthma May use albuterol  2 puffs once every 4 hours as needed for cough or wheeze You may use albuterol  2 puffs 5-15 minutes before activity  Asthma control goals:  Full participation in all desired activities (may need albuterol  before activity) Albuterol  use two time or less a week on average (not counting use with activity) Cough interfering with sleep two time or less a month Oral steroids no more than once a year No hospitalizations  Allergic rhinitis Continue allergen avoidance measures directed toward pollens, pets, mold, ragweed, and cockroach as listed below Continue allergen immunotherapy per protocol and have access to an epinephrine  autoinjector set Continue Allegra 180 mg once a day if needed for runny nose or itch.  You may take an additional dose of Allegra 180 mg once a day if needed for breakthrough symptoms.  Take this medication on the day before and the day of your allergen immunotherapy Consider saline nasal rinses as needed for nasal symptoms. Use this before any medicated nasal sprays for best result  Oral allergy syndrome Continue to avoid foods that irritate your mouth  Dermatitis Continue a twice a day moisturizing routine Begin triamcinolone to to red or itchy areas underneath your face up to twice a day  Allergic contact dermatitis Consider patch testing.  Patches are placed on Monday, removed in the first week  Epistaxis Pinch both nostrils while leaning forward for at least 5 minutes before checking to see if the bleeding has stopped. If bleeding is not controlled within 5-10 minutes apply a cotton ball soaked with oxymetazoline (Afrin) to the bleeding nostril for a few seconds.  If the problem persists or worsens a referral to ENT for further  evaluation may be necessary.  Call the clinic if this treatment plan is not working well for you.  Follow up in 6 months or sooner if needed.   Return in about 6 months (around 07/13/2024), or if symptoms worsen or fail to improve.    Thank you for the opportunity to care for this patient.  Please do not hesitate to contact me with questions.  Marinus Sic, FNP [facility] 

## 2024-01-10 NOTE — Patient Instructions (Addendum)
 Asthma May use albuterol  2 puffs once every 4 hours as needed for cough or wheeze You may use albuterol  2 puffs 5-15 minutes before activity  Asthma control goals:  Full participation in all desired activities (may need albuterol  before activity) Albuterol  use two time or less a week on average (not counting use with activity) Cough interfering with sleep two time or less a month Oral steroids no more than once a year No hospitalizations  Allergic rhinitis Continue allergen avoidance measures directed toward pollens, pets, mold, ragweed, and cockroach as listed below Continue allergen immunotherapy per protocol and have access to an epinephrine  autoinjector set Continue Allegra 180 mg once a day if needed for runny nose or itch.  You may take an additional dose of Allegra 180 mg once a day if needed for breakthrough symptoms.  Take this medication on the day before and the day of your allergen immunotherapy Consider saline nasal rinses as needed for nasal symptoms. Use this before any medicated nasal sprays for best result  Oral allergy syndrome Continue to avoid foods that irritate your mouth  Dermatitis Continue a twice a day moisturizing routine Begin triamcinolone to to red or itchy areas underneath your face up to twice a day  Allergic contact dermatitis Consider patch testing.  Patches are placed on Monday, removed in the first week  Epistaxis Pinch both nostrils while leaning forward for at least 5 minutes before checking to see if the bleeding has stopped. If bleeding is not controlled within 5-10 minutes apply a cotton ball soaked with oxymetazoline (Afrin) to the bleeding nostril for a few seconds.  If the problem persists or worsens a referral to ENT for further evaluation may be necessary.  Call the clinic if this treatment plan is not working well for you.  Follow up in 6 months or sooner if needed.  Reducing Pollen Exposure The American Academy of Allergy, Asthma and  Immunology suggests the following steps to reduce your exposure to pollen during allergy seasons. Do not hang sheets or clothing out to dry; pollen may collect on these items. Do not mow lawns or spend time around freshly cut grass; mowing stirs up pollen. Keep windows closed at night.  Keep car windows closed while driving. Minimize morning activities outdoors, a time when pollen counts are usually at their highest. Stay indoors as much as possible when pollen counts or humidity is high and on windy days when pollen tends to remain in the air longer. Use air conditioning when possible.  Many air conditioners have filters that trap the pollen spores. Use a HEPA room air filter to remove pollen form the indoor air you breathe.  Control of Mold Allergen Mold and fungi can grow on a variety of surfaces provided certain temperature and moisture conditions exist.  Outdoor molds grow on plants, decaying vegetation and soil.  The major outdoor mold, Alternaria and Cladosporium, are found in very high numbers during hot and dry conditions.  Generally, a late Summer - Fall peak is seen for common outdoor fungal spores.  Rain will temporarily lower outdoor mold spore count, but counts rise rapidly when the rainy period ends.  The most important indoor molds are Aspergillus and Penicillium.  Dark, humid and poorly ventilated basements are ideal sites for mold growth.  The next most common sites of mold growth are the bathroom and the kitchen.  Outdoor [employer] Use air conditioning and keep windows closed Avoid exposure to decaying vegetation. Avoid leaf raking. Avoid grain handling.  Consider wearing a face mask if working in moldy areas.  Indoor Mold Control Maintain humidity below 50%. Clean washable surfaces with 5% bleach solution. Remove sources e.g. Contaminated carpets.  Hives (urticaria)  Cetirizine (Zyrtec) 10mg  twice a day and famotidine (Pepcid) 20 mg twice a day. If no symptoms for 7-14  days then decrease to. Cetirizine (Zyrtec) 10mg  twice a day and famotidine (Pepcid) 20 mg once a day.  If no symptoms for 7-14 days then decrease to. Cetirizine (Zyrtec) 10mg  twice a day.  If no symptoms for 7-14 days then decrease to. Cetirizine (Zyrtec) 10mg  once a day.  May use Benadryl (diphenhydramine) as needed for breakthrough hives       If symptoms return, then step up dosage  Keep a detailed symptom journal including foods eaten, contact with allergens, medications taken, weather changes.    Control of Dog or Cat Allergen Avoidance is the best way to manage a dog or cat allergy. If you have a dog or cat and are allergic to dog or cats, consider removing the dog or cat from the home. If you have a dog or cat but don't want to find it a new home, or if your family wants a pet even though someone in the household is allergic, here are some strategies that may help keep symptoms at bay:  Keep the pet out of your bedroom and restrict it to only a few rooms. Be advised that keeping the dog or cat in only one room will not limit the allergens to that room. Don't pet, hug or kiss the dog or cat; if you do, wash your hands with soap and water. High-efficiency particulate air (HEPA) cleaners run continuously in a bedroom or living room can reduce allergen levels over time. Regular use of a high-efficiency vacuum cleaner or a central vacuum can reduce allergen levels. Giving your dog or cat a bath at least once a week can reduce airborne allergen.  Control of Cockroach Allergen Cockroach allergen has been identified as an important cause of acute attacks of asthma, especially in urban settings.  There are fifty-five species of cockroach that exist in the [place] , however only three, the [place], [place] and [place] species produce allergen that can affect patients with Asthma.  Allergens can be obtained from fecal particles, egg casings and secretions from cockroaches.    Remove food  sources. Reduce access to water. Seal access and entry points. Spray runways with 0.5-1% Diazinon or Chlorpyrifos Blow boric acid power under stoves and refrigerator. Place bait stations (hydramethylnon) at feeding sites.

## 2024-01-11 ENCOUNTER — Ambulatory Visit (INDEPENDENT_AMBULATORY_CARE_PROVIDER_SITE_OTHER): Admitting: Family Medicine

## 2024-01-11 ENCOUNTER — Encounter: Payer: Self-pay | Admitting: Family Medicine

## 2024-01-11 ENCOUNTER — Other Ambulatory Visit: Payer: Self-pay

## 2024-01-11 VITALS — BP 118/74 | HR 68 | Temp 98.6°F | Wt 288.4 lb

## 2024-01-11 DIAGNOSIS — T781XXD Other adverse food reactions, not elsewhere classified, subsequent encounter: Secondary | ICD-10-CM

## 2024-01-11 DIAGNOSIS — J302 Other seasonal allergic rhinitis: Secondary | ICD-10-CM

## 2024-01-11 DIAGNOSIS — J4520 Mild intermittent asthma, uncomplicated: Secondary | ICD-10-CM

## 2024-01-11 DIAGNOSIS — R040 Epistaxis: Secondary | ICD-10-CM | POA: Insufficient documentation

## 2024-01-11 DIAGNOSIS — L2084 Intrinsic (allergic) eczema: Secondary | ICD-10-CM | POA: Diagnosis not present

## 2024-01-11 DIAGNOSIS — L235 Allergic contact dermatitis due to other chemical products: Secondary | ICD-10-CM

## 2024-01-11 DIAGNOSIS — L259 Unspecified contact dermatitis, unspecified cause: Secondary | ICD-10-CM | POA: Insufficient documentation

## 2024-01-11 DIAGNOSIS — J3089 Other allergic rhinitis: Secondary | ICD-10-CM | POA: Diagnosis not present

## 2024-01-11 MED ORDER — EPINEPHRINE 0.3 MG/0.3ML IJ SOAJ: 0.3000 mg | INTRAMUSCULAR | 1 refills | Status: AC | PRN

## 2024-01-11 MED ORDER — TRIAMCINOLONE ACETONIDE 0.1 % EX OINT: 1.0000 | TOPICAL_OINTMENT | Freq: Two times a day (BID) | CUTANEOUS | 3 refills | Status: AC

## 2024-01-19 ENCOUNTER — Telehealth: Payer: Self-pay

## 2024-01-19 ENCOUNTER — Other Ambulatory Visit (HOSPITAL_COMMUNITY): Payer: Self-pay

## 2024-01-19 NOTE — Telephone Encounter (Signed)
*  Asthma/Allergy  Pharmacy Patient Advocate Encounter   Received notification from Fax that prior authorization for Epinephrine  injector is required/requested.   Insurance verification completed.   The patient is insured through [facility] .   Per test claim: The current 30 day co-pay is, $281.69.  No PA needed at this time. This test claim was processed through [facility]- copay amounts may vary at other pharmacies due to pharmacy/plan contracts, or as the patient moves through the different stages of their insurance plan.

## 2024-01-21 ENCOUNTER — Other Ambulatory Visit: Payer: Self-pay

## 2024-01-21 ENCOUNTER — Encounter (HOSPITAL_COMMUNITY): Payer: Self-pay | Admitting: Emergency Medicine

## 2024-01-21 ENCOUNTER — Ambulatory Visit (HOSPITAL_COMMUNITY)
Admission: EM | Admit: 2024-01-21 | Discharge: 2024-01-21 | Disposition: A | Discharge status: Home / Self Care | Attending: Nurse Practitioner | Admitting: Nurse Practitioner

## 2024-01-21 DIAGNOSIS — Z8709 Personal history of other diseases of the respiratory system: Secondary | ICD-10-CM | POA: Diagnosis not present

## 2024-01-21 DIAGNOSIS — J019 Acute sinusitis, unspecified: Secondary | ICD-10-CM | POA: Diagnosis not present

## 2024-01-21 DIAGNOSIS — H9203 Otalgia, bilateral: Secondary | ICD-10-CM

## 2024-01-21 HISTORY — DX: Disorder of thyroid, unspecified: E07.9

## 2024-01-21 MED ORDER — METHYLPREDNISOLONE 4 MG PO TBPK: ORAL_TABLET | ORAL | 0 refills | Status: AC

## 2024-01-21 MED ORDER — FLUTICASONE PROPIONATE 50 MCG/ACT NA SUSP: 2.0000 | Freq: Every day | NASAL | 0 refills | Status: AC

## 2024-01-21 MED ORDER — PSEUDOEPH-BROMPHEN-DM 30-2-10 MG/5ML PO SYRP: 10.0000 mL | ORAL_SOLUTION | Freq: Four times a day (QID) | ORAL | 0 refills | Status: AC | PRN

## 2024-01-21 MED ORDER — AMOXICILLIN 875 MG PO TABS: 875.0000 mg | ORAL_TABLET | Freq: Two times a day (BID) | ORAL | 0 refills | Status: AC

## 2024-01-21 NOTE — ED Notes (Signed)
 Pt stopped at front desk and stated he needed to go get his kids he can see his papers on mychart.

## 2024-01-21 NOTE — ED Provider Notes (Signed)
 [facility]    CSN: [id number] Arrival date & time: 01/21/24  1015      History   Chief Complaint No chief complaint on file.   HPI Brian Webster is a 33 y.o. male.   Brian Webster is a 33 y.o. male with a history of allergic asthma and hypothyroidism that presents with multiple symptoms including light sensitivity, headaches, sinus pressure, sore throat, body aches, and difficulty swallowing for the past week. He describes a painful and difficult swallow, stating that "everything burns." Body aches are present, but he denies fever. Both ears are bothering him equally. The patient confirms the presence of sinus pressure and sore throat. He denies nasal congestion, chills, sweats, cough, wheezing, shortness of breath, nausea, vomiting, or diarrhea. He has close exposure with someone with similar symptoms who was recently diagnosed with a sinus infection.  The patient has not tried anything for his symptoms and has not had to use his asthma inhaler. He takes Allegra as needed but often forgets due to conflicts with his thyroid  medication. He is not currently using his prescribed nasal spray, Singulair , or multivitamin. The patient continues to take levothyroxine  for his thyroid  condition and has an EpiPen  available.  The following portions of the patient's history were reviewed and updated as appropriate: allergies, current medications, past family history, past medical history, past social history, past surgical history, and problem list.    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 Authorizing Provider  amoxicillin (AMOXIL) 875 MG tablet Take 1 tablet (875 mg total) by mouth 2 (two) times daily for 7 days. 01/21/24 01/28/24 Yes Maryruth Sol, FNP  brompheniramine-pseudoephedrine-DM 30-2-10 MG/5ML syrup Take 10 mLs by mouth every 6 (six) hours as needed (cough and congestion). 01/21/24  Yes Irene Collings, FNP  fluticasone (FLONASE) 50 MCG/ACT nasal spray Place 2 sprays into both nostrils daily. Shake well before use. Gently blow nose before spraying. Do not blow nose immediately after use. You should not taste the medication or feel it going down your throat; if you do, adjust your technique. 01/21/24  Yes Maryruth Sol, FNP  methylPREDNISolone (MEDROL DOSEPAK) 4 MG TBPK tablet Take as directed 01/21/24  Yes Maryruth Sol, FNP  albuterol  (VENTOLIN  HFA) 108 (90 Base) MCG/ACT inhaler Inhale 2 puffs into the lungs every 4 (four) hours as needed for wheezing or shortness of breath. Patient not taking: Reported on 01/11/2024 10/07/22   Ardie Kras, FNP  EPINEPHrine  (EPIPEN  2-PAK) 0.3 mg/0.3 mL IJ SOAJ injection Inject 0.3 mg into the muscle as needed for anaphylaxis. 01/11/24   Ardie Kras, FNP  Fexofenadine HCl (ALLEGRA ALLERGY PO) Take by mouth as needed.    [provider]  levothyroxine  (SYNTHROID ) 100 MCG tablet TAKE 1 TABLET(100 MCG) BY MOUTH DAILY 03/20/23   Elvira Hammersmith, MD  triamcinolone  ointment (KENALOG ) 0.1 % Apply 1 Application topically 2 (two) times  daily. 01/11/24   Ardie Kras, FNP    Family History Family History  Problem Relation Age of Onset   Heart disease Mother    Hypertension Mother    Allergic rhinitis Brother    Allergic rhinitis Son     Social History Social History   Tobacco Use   Smoking status: Never    Passive exposure: Never   Smokeless tobacco: Never  Vaping Use   Vaping status: Never Used  Substance Use Topics   Alcohol use: Yes   Drug use: No     Allergies   Patient has no known allergies.   Review of Systems Review of Systems  Constitutional:  Negative for chills, diaphoresis and fever.       Hot flashes  HENT:  Positive for congestion, ear pain, postnasal drip, rhinorrhea, sinus pressure, sore throat and trouble swallowing.   Eyes:  Positive for photophobia.  Respiratory:  Positive for cough (mostly dry; minimally productive) and wheezing. Negative for shortness of breath.   Gastrointestinal:  Positive for nausea. Negative for diarrhea and vomiting.  Musculoskeletal:  Positive for myalgias.  Neurological:  Positive for headaches.  All other systems reviewed and are negative.    Physical Exam Triage Vital Signs ED Triage Vitals  Encounter Vitals Group     BP 01/21/24 1051 133/84     Systolic BP Percentile --      Diastolic BP Percentile --      Pulse Rate 01/21/24 1051 78     Resp 01/21/24 1051 20     Temp 01/21/24 1051 98.7 F (37.1 C)     Temp Source 01/21/24 1051 Oral     SpO2 01/21/24 1051 96 %     Weight --      Height --      Head Circumference --      Peak Flow --      Pain Score 01/21/24 1048 8     Pain Loc --      Pain Education --      Exclude from Growth Chart --    No data found.  Updated Vital Signs BP 133/84 (BP Location: Left Arm) Comment (BP Location): large cuff  Pulse 78   Temp 98.7 F (37.1 C) (Oral)   Resp 20   SpO2 96%   Visual Acuity Right Eye Distance:   Left Eye Distance:   Bilateral Distance:    Right Eye Near:   Left Eye Near:     Bilateral Near:     Physical Exam Vitals reviewed.  Constitutional:      General: He is awake. He is not in acute distress.    Appearance: Normal appearance. He is well-developed. He is obese. He is not ill-appearing, toxic-appearing or diaphoretic.  HENT:     Head: Normocephalic.     Right Ear: Hearing and external ear normal. There is impacted cerumen.     Left Ear: Hearing and external ear normal. There is impacted cerumen.     Nose: Congestion present.     Right Sinus: Frontal sinus tenderness present. No maxillary sinus tenderness.     Left Sinus: Frontal sinus tenderness present. No maxillary sinus tenderness.  Mouth/Throat:     Lips: Pink.     Mouth: Mucous membranes are moist.     Pharynx: Oropharynx is clear. Uvula midline. No pharyngeal swelling, oropharyngeal exudate, posterior oropharyngeal erythema or uvula swelling.     Tonsils: No tonsillar exudate or tonsillar abscesses.  Eyes:     General: Vision grossly intact.     Conjunctiva/sclera: Conjunctivae normal.  Cardiovascular:     Rate and Rhythm: Normal rate and regular rhythm.     Heart sounds: Normal heart sounds.  Pulmonary:     Effort: Pulmonary effort is normal.     Breath sounds: Normal breath sounds and air entry.  Musculoskeletal:        General: Normal range of motion.     Cervical back: Full passive range of motion without pain, normal range of motion and neck supple.  Lymphadenopathy:     Cervical: No cervical adenopathy.  Skin:    General: Skin is warm and dry.  Neurological:     General: No focal deficit present.     Mental Status: He is alert and oriented to person, place, and time.  Psychiatric:        Mood and Affect: Mood normal.        Behavior: Behavior normal. Behavior is cooperative.      UC Treatments / Results  Labs (all labs ordered are listed, but only abnormal results are displayed) Labs Reviewed - No data to display  EKG   Radiology No results  found.  Procedures Procedures (including critical care time)  Medications Ordered in UC Medications - No data to display  Initial Impression / Assessment and Plan / UC Course  I have reviewed the triage vital signs and the nursing notes.  Pertinent labs & imaging results that were available during my care of the patient were reviewed by me and considered in my medical decision making (see chart for details).     Patient presents with a one-week history of symptoms consistent with acute sinusitis, including sinus pressure, headache, photophobia, sore throat, difficulty swallowing, and body aches. No fever is reported. The duration and persistence of symptoms support consideration for bacterial involvement. Antibiotic therapy is initiated along with an oral steroid to reduce inflammation and improve symptom control. Symptomatic treatment is supported with Bromfed-DM for cough, Flonase nasal spray, and continued daily use of Allegra. Supportive measures such as increased fluid intake, use of a humidifier, and head elevation are advised. Patient was also instructed to replace their toothbrush after completing antibiotics and to follow up if symptoms, particularly ear pain, persist or worsen.   Today's evaluation has revealed no signs of a dangerous process. Discussed diagnosis with patient and/or guardian. Patient and/or guardian aware of their diagnosis, possible red flag symptoms to watch out for and need for close follow up. Patient and/or guardian understands verbal and written discharge instructions. Patient and/or guardian comfortable with plan and disposition.  Patient and/or guardian has a clear mental status at this time, good insight into illness (after discussion and teaching) and has clear judgment to make decisions regarding their care  Documentation was completed with the aid of voice recognition software. Transcription may contain typographical errors. Final Clinical Impressions(s) /  UC Diagnoses   Final diagnoses:  Acute non-recurrent sinusitis, unspecified location  History of asthma  Acute otalgia, bilateral     Discharge Instructions      You were seen today for symptoms consistent with a sinus infection. While most are viral, the duration of your  symptoms suggests a possible bacterial cause, so an antibiotic was prescribed to help treat the infection.  You have also been given an oral steroid to help reduce the inflammation in your sinuses and relieve pain. For your cough and congestion, Bromfed-DM has been prescribed. You should take it every six hours as needed, but it's recommended to take it regularly around the clock for the first few days to get the best relief, and then switch to taking it only as needed once you begin to feel better.  You should start using Flonase nasal spray daily--two sprays in each nostril every morning--to help reduce nasal inflammation. Continue taking Allegra once a day to help manage allergy-related symptoms.  To support your recovery at home, be sure to drink plenty of fluids to help thin out mucus and keep your body hydrated. Using a humidifier at night next to your bed can help ease nasal congestion and improve breathing while you sleep. Try to keep your head elevated by sleeping with an extra pillow to help sinus drainage and reduce pressure.  Once you finish your antibiotic course, it's important to replace your toothbrush to avoid reintroducing any bacteria. If your symptoms worsen or your ear pain does not go away after completing treatment, please follow up with your healthcare provider for further evaluation.    ED Prescriptions     Medication Sig Dispense Auth. Provider   amoxicillin (AMOXIL) 875 MG tablet Take 1 tablet (875 mg total) by mouth 2 (two) times daily for 7 days. 14 tablet Jacinda Kanady, [place], FNP   methylPREDNISolone (MEDROL DOSEPAK) 4 MG TBPK tablet Take as directed 21 tablet Maryruth Sol, FNP    brompheniramine-pseudoephedrine-DM 30-2-10 MG/5ML syrup Take 10 mLs by mouth every 6 (six) hours as needed (cough and congestion). 120 mL Galina Haddox, FNP   fluticasone (FLONASE) 50 MCG/ACT nasal spray Place 2 sprays into both nostrils daily. Shake well before use. Gently blow nose before spraying. Do not blow nose immediately after use. You should not taste the medication or feel it going down your throat; if you do, adjust your technique. 16 g Maryruth Sol, FNP      PDMP not reviewed this encounter.   Maryruth Sol, [place] 01/21/24 1243

## 2024-01-21 NOTE — Discharge Instructions (Addendum)
 You were seen today for symptoms consistent with a sinus infection. While most are viral, the duration of your symptoms suggests a possible bacterial cause, so an antibiotic was prescribed to help treat the infection.  You have also been given an oral steroid to help reduce the inflammation in your sinuses and relieve pain. For your cough and congestion, Bromfed-DM has been prescribed. You should take it every six hours as needed, but it's recommended to take it regularly around the clock for the first few days to get the best relief, and then switch to taking it only as needed once you begin to feel better.  You should start using Flonase nasal spray daily--two sprays in each nostril every morning--to help reduce nasal inflammation. Continue taking Allegra once a day to help manage allergy-related symptoms.  To support your recovery at home, be sure to drink plenty of fluids to help thin out mucus and keep your body hydrated. Using a humidifier at night next to your bed can help ease nasal congestion and improve breathing while you sleep. Try to keep your head elevated by sleeping with an extra pillow to help sinus drainage and reduce pressure.  Once you finish your antibiotic course, it's important to replace your toothbrush to avoid reintroducing any bacteria. If your symptoms worsen or your ear pain does not go away after completing treatment, please follow up with your healthcare provider for further evaluation.

## 2024-01-21 NOTE — ED Triage Notes (Signed)
 Symptoms for 4 days.  Complains of body aches, sore throat, hot flashes, pressure and pain in forehead and ears, throat burns and feels like glass.    Patient has not taken any medications for symptoms

## 2024-01-29 ENCOUNTER — Ambulatory Visit (INDEPENDENT_AMBULATORY_CARE_PROVIDER_SITE_OTHER): Payer: Self-pay

## 2024-01-29 DIAGNOSIS — J309 Allergic rhinitis, unspecified: Secondary | ICD-10-CM

## 2024-02-01 ENCOUNTER — Telehealth: Payer: Self-pay | Admitting: Family Medicine

## 2024-02-01 ENCOUNTER — Encounter (INDEPENDENT_AMBULATORY_CARE_PROVIDER_SITE_OTHER): Payer: Self-pay

## 2024-02-01 NOTE — Telephone Encounter (Signed)
 Brian Webster has been internally referred to [facility].  They will reach out to him to schedule.   I will follow up in a week.

## 2024-02-12 DIAGNOSIS — J3081 Allergic rhinitis due to animal (cat) (dog) hair and dander: Secondary | ICD-10-CM | POA: Diagnosis not present

## 2024-02-12 NOTE — Progress Notes (Signed)
 VIALS MADE 02-12-24

## 2024-02-13 DIAGNOSIS — J3089 Other allergic rhinitis: Secondary | ICD-10-CM | POA: Diagnosis not present

## 2024-03-06 ENCOUNTER — Encounter (INDEPENDENT_AMBULATORY_CARE_PROVIDER_SITE_OTHER): Payer: Self-pay

## 2024-03-07 ENCOUNTER — Ambulatory Visit

## 2024-03-07 DIAGNOSIS — J309 Allergic rhinitis, unspecified: Secondary | ICD-10-CM | POA: Diagnosis not present

## 2024-03-08 NOTE — Telephone Encounter (Signed)
 [facility] reached out to [facility] several times and [facility] never responded.  They closed out the referral due to unable to contact.

## 2024-04-01 ENCOUNTER — Encounter: Payer: Self-pay | Admitting: Emergency Medicine

## 2024-04-01 ENCOUNTER — Ambulatory Visit: Payer: Self-pay | Admitting: Emergency Medicine

## 2024-04-01 ENCOUNTER — Ambulatory Visit: Admitting: Emergency Medicine

## 2024-04-01 VITALS — BP 120/90 | HR 82 | Temp 98.2°F | Ht 74.0 in | Wt 286.0 lb

## 2024-04-01 DIAGNOSIS — Z6836 Body mass index (BMI) 36.0-36.9, adult: Secondary | ICD-10-CM

## 2024-04-01 DIAGNOSIS — Z1329 Encounter for screening for other suspected endocrine disorder: Secondary | ICD-10-CM

## 2024-04-01 DIAGNOSIS — Z13220 Encounter for screening for lipoid disorders: Secondary | ICD-10-CM | POA: Diagnosis not present

## 2024-04-01 DIAGNOSIS — E66812 Obesity, class 2: Secondary | ICD-10-CM | POA: Diagnosis not present

## 2024-04-01 DIAGNOSIS — Z0000 Encounter for general adult medical examination without abnormal findings: Secondary | ICD-10-CM

## 2024-04-01 DIAGNOSIS — Z130 Encounter for screening for diseases of the blood and blood-forming organs and certain disorders involving the immune mechanism: Secondary | ICD-10-CM | POA: Diagnosis not present

## 2024-04-01 DIAGNOSIS — E6609 Other obesity due to excess calories: Secondary | ICD-10-CM | POA: Insufficient documentation

## 2024-04-01 DIAGNOSIS — E039 Hypothyroidism, unspecified: Secondary | ICD-10-CM

## 2024-04-01 DIAGNOSIS — Z0001 Encounter for general adult medical examination with abnormal findings: Secondary | ICD-10-CM

## 2024-04-01 DIAGNOSIS — Z13228 Encounter for screening for other metabolic disorders: Secondary | ICD-10-CM | POA: Diagnosis not present

## 2024-04-01 LAB — CBC WITH DIFFERENTIAL/PLATELET
Basophils Absolute: 0 K/uL (ref 0.0–0.1)
Basophils Relative: 1.2 % (ref 0.0–3.0)
Eosinophils Absolute: 0 K/uL (ref 0.0–0.7)
Eosinophils Relative: 0.8 % (ref 0.0–5.0)
HCT: 42.9 % (ref 39.0–52.0)
Hemoglobin: 14.9 g/dL (ref 13.0–17.0)
Lymphocytes Relative: 53.8 % — ABNORMAL HIGH (ref 12.0–46.0)
Lymphs Abs: 2.2 K/uL (ref 0.7–4.0)
MCHC: 34.6 g/dL (ref 30.0–36.0)
MCV: 81.7 fl (ref 78.0–100.0)
Monocytes Absolute: 0.2 K/uL (ref 0.1–1.0)
Monocytes Relative: 6.1 % (ref 3.0–12.0)
Neutro Abs: 1.5 K/uL (ref 1.4–7.7)
Neutrophils Relative %: 38.1 % — ABNORMAL LOW (ref 43.0–77.0)
Platelets: 219 K/uL (ref 150.0–400.0)
RBC: 5.25 Mil/uL (ref 4.22–5.81)
RDW: 14.2 % (ref 11.5–15.5)
WBC: 4 K/uL (ref 4.0–10.5)

## 2024-04-01 LAB — LIPID PANEL
Cholesterol: 271 mg/dL — ABNORMAL HIGH (ref 0–200)
HDL: 41.7 mg/dL (ref >39.00)
LDL Cholesterol: 191 mg/dL — ABNORMAL HIGH (ref 0–99)
NonHDL: 229.59
Total CHOL/HDL Ratio: 7
Triglycerides: 192 mg/dL — ABNORMAL HIGH (ref 0.0–149.0)
VLDL: 38.4 mg/dL (ref 0.0–40.0)

## 2024-04-01 LAB — COMPREHENSIVE METABOLIC PANEL WITH GFR
ALT: 34 U/L (ref 0–53)
AST: 22 U/L (ref 0–37)
Albumin: 4.5 g/dL (ref 3.5–5.2)
Alkaline Phosphatase: 40 U/L (ref 39–117)
BUN: 12 mg/dL (ref 6–23)
CO2: 26 meq/L (ref 19–32)
Calcium: 9.5 mg/dL (ref 8.4–10.5)
Chloride: 101 meq/L (ref 96–112)
Creatinine, Ser: 0.93 mg/dL (ref 0.40–1.50)
GFR: 107.12 mL/min (ref >60.00)
Glucose, Bld: 99 mg/dL (ref 70–99)
Potassium: 3.8 meq/L (ref 3.5–5.1)
Sodium: 136 meq/L (ref 135–145)
Total Bilirubin: 0.9 mg/dL (ref 0.2–1.2)
Total Protein: 7.5 g/dL (ref 6.0–8.3)

## 2024-04-01 LAB — HEMOGLOBIN A1C: Hgb A1c MFr Bld: 6 % (ref 4.6–6.5)

## 2024-04-01 LAB — TSH: TSH: 4.57 u[IU]/mL (ref 0.35–5.50)

## 2024-04-01 NOTE — Assessment & Plan Note (Signed)
 Diet and nutrition discussed Advised to decrease amount of daily carbohydrate intake and daily calories and increase amount of plant-based protein in his diet Benefits of exercise discussed Referral to nutritionist placed today.

## 2024-04-01 NOTE — Progress Notes (Signed)
 Brian Webster 33 y.o.   Chief Complaint  Patient presents with   Annual Exam    Patient here for physical. Patient has questions about his thyroid  states its been hard for him to manage this and wants to see someone regularly for this. Wants a referral for nutritionist     HISTORY OF PRESENT ILLNESS: This is a 33 y.o. male here for annual exam History of hypothyroidism on Synthroid .  Requesting referral to endocrinologist Also requesting referral to nutritionist No other complaints or medical concerns today.  HPI   Prior to Admission medications   Medication Sig Start Date End Date Taking? Authorizing Provider  EPINEPHrine  (EPIPEN  2-PAK) 0.3 mg/0.3 mL IJ SOAJ injection Inject 0.3 mg into the muscle as needed for anaphylaxis. 01/11/24  Yes Ambs, Arlean HERO, FNP  Fexofenadine HCl (ALLEGRA ALLERGY PO) Take by mouth as needed.   Yes [provider]  levothyroxine  (SYNTHROID ) 100 MCG tablet TAKE 1 TABLET(100 MCG) BY MOUTH DAILY 03/20/23  Yes Abhinav Mayorquin, Emil Schanz, MD  triamcinolone  ointment (KENALOG ) 0.1 % Apply 1 Application topically 2 (two) times daily. 01/11/24  Yes Ambs, Arlean HERO, FNP  albuterol  (VENTOLIN  HFA) 108 (90 Base) MCG/ACT inhaler Inhale 2 puffs into the lungs every 4 (four) hours as needed for wheezing or shortness of breath. Patient not taking: Reported on 04/01/2024 10/07/22   Cari Arlean HERO, FNP  brompheniramine-pseudoephedrine-DM 30-2-10 MG/5ML syrup Take 10 mLs by mouth every 6 (six) hours as needed (cough and congestion). Patient not taking: Reported on 04/01/2024 01/21/24   Iola Lukes, FNP  fluticasone  (FLONASE ) 50 MCG/ACT nasal spray Place 2 sprays into both nostrils daily. Shake well before use. Gently blow nose before spraying. Do not blow nose immediately after use. You should not taste the medication or feel it going down your throat; if you do, adjust your technique. 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   Sensitive skin 10/05/2021   Chronic right shoulder pain 07/13/2021   History of multiple allergies 07/13/2021   Hypothyroidism 07/13/2021   Chronic depression 12/29/2020   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    Past Medical History:  Diagnosis Date   Asthma    Recurrent upper respiratory infection (URI)    Thyroid  disease     Past Surgical History:  Procedure Laterality Date   TONSILECTOMY, ADENOIDECTOMY, BILATERAL MYRINGOTOMY AND TUBES     TONSILLECTOMY      Social History   Socioeconomic History   Marital status: Single    Spouse name: Not on file   Number of children: Not on file   Years of education: Not on file   Highest education level: Not on file  Occupational History   Not on file  Tobacco Use   Smoking status: Never    Passive exposure: Never   Smokeless tobacco: Never  Vaping Use   Vaping status: Never Used  Substance and Sexual Activity   Alcohol use: Yes   Drug use: No   Sexual activity: Yes  Other Topics Concern   Not on file  Social  History Narrative   Not on file   Social Drivers of Health   Financial Resource Strain: Not on file  Food Insecurity: Not on file  Transportation Needs: Not on file  Physical Activity: Not on file  Stress: Not on file  Social Connections: Not on file  Intimate Partner Violence: Not on file    Family History  Problem Relation Age of Onset   Heart disease Mother    Hypertension Mother    Allergic rhinitis Brother    Allergic rhinitis Son      Review of  Systems  Constitutional: Negative.  Negative for chills and fever.  HENT: Negative.  Negative for congestion and sore throat.   Respiratory: Negative.  Negative for cough and shortness of breath.   Cardiovascular: Negative.  Negative for chest pain and palpitations.  Gastrointestinal:  Negative for abdominal pain, diarrhea, nausea and vomiting.  Genitourinary: Negative.  Negative for dysuria and hematuria.  Skin: Negative.  Negative for rash.  Neurological: Negative.  Negative for dizziness and headaches.  All other systems reviewed and are negative.   Vitals:   04/01/24 0951  BP: (!) 120/90  Pulse: 82  Temp: 98.2 F (36.8 C)  SpO2: 98%    Physical Exam Vitals reviewed.  Constitutional:      Appearance: Normal appearance.  HENT:     Head: Normocephalic.     Right Ear: Tympanic membrane, ear canal and external ear normal.     Left Ear: Tympanic membrane, ear canal and external ear normal.     Mouth/Throat:     Mouth: Mucous membranes are moist.     Pharynx: Oropharynx is clear.  Eyes:     Extraocular Movements: Extraocular movements intact.     Conjunctiva/sclera: Conjunctivae normal.     Pupils: Pupils are equal, round, and reactive to light.  Cardiovascular:     Rate and Rhythm: Normal rate and regular rhythm.     Pulses: Normal pulses.     Heart sounds: Normal heart sounds.  Pulmonary:     Effort: Pulmonary effort is normal.     Breath sounds: Normal breath sounds.  Abdominal:     Palpations: Abdomen is soft.     Tenderness: There is no abdominal tenderness.  Musculoskeletal:     Cervical back: No tenderness.  Lymphadenopathy:     Cervical: No cervical adenopathy.  Skin:    General: Skin is warm and dry.     Capillary Refill: Capillary refill takes less than 2 seconds.  Neurological:     General: No focal deficit present.     Mental Status: He is alert and oriented to person, place, and time.  Psychiatric:        Mood and Affect: Mood normal.         Behavior: Behavior normal.      ASSESSMENT & PLAN: Problem List Items Addressed This Visit       Endocrine   Hypothyroidism   Clinically euthyroid TSH done today Continue Synthroid  100 mcg daily Wants referral to endocrinologist Referral placed today.      Relevant Orders   TSH   Hemoglobin A1c   Other Visit Diagnoses       Encounter for general adult medical examination with abnormal findings    -  Primary   Relevant Orders   Comprehensive metabolic panel with GFR   CBC with Differential/Platelet   TSH   Hemoglobin A1c   Lipid panel   Amb ref to Medical Nutrition Therapy-MNT  Screening for deficiency anemia       Relevant Orders   CBC with Differential/Platelet     Screening for lipoid disorders       Relevant Orders   Lipid panel     Screening for endocrine, metabolic and immunity disorder       Relevant Orders   Comprehensive metabolic panel with GFR      Modifiable risk factors discussed with patient. Anticipatory guidance according to age provided. The following topics were also discussed: Social Determinants of Health Smoking.  Non-smoker Diet and nutrition Benefits of exercise Cancer family history review Vaccinations review and recommendations Cardiovascular risk assessment and need for blood work Mental health including depression and anxiety Fall and accident prevention  Patient Instructions  Health Maintenance, Male Adopting a healthy lifestyle and getting preventive care are important in promoting health and wellness. Ask your health care provider about: The right schedule for you to have regular tests and exams. Things you can do on your own to prevent diseases and keep yourself healthy. What should I know about diet, weight, and exercise? Eat a healthy diet  Eat a diet that includes plenty of vegetables, fruits, low-fat dairy products, and lean protein. Do not eat a lot of foods that are high in solid fats, added sugars, or  sodium. Maintain a healthy weight Body mass index (BMI) is a measurement that can be used to identify possible weight problems. It estimates body fat based on height and weight. Your health care provider can help determine your BMI and help you achieve or maintain a healthy weight. Get regular exercise Get regular exercise. This is one of the most important things you can do for your health. Most adults should: Exercise for at least 150 minutes each week. The exercise should increase your heart rate and make you sweat (moderate-intensity exercise). Do strengthening exercises at least twice a week. This is in addition to the moderate-intensity exercise. Spend less time sitting. Even light physical activity can be beneficial. Watch cholesterol and blood lipids Have your blood tested for lipids and cholesterol at 34 years of age, then have this test every 5 years. You may need to have your cholesterol levels checked more often if: Your lipid or cholesterol levels are high. You are older than 34 years of age. You are at high risk for heart disease. What should I know about cancer screening? Many types of cancers can be detected early and may often be prevented. Depending on your health history and family history, you may need to have cancer screening at various ages. This may include screening for: Colorectal cancer. Prostate cancer. Skin cancer. Lung cancer. What should I know about heart disease, diabetes, and high blood pressure? Blood pressure and heart disease High blood pressure causes heart disease and increases the risk of stroke. This is more likely to develop in people who have high blood pressure readings or are overweight. Talk with your health care provider about your target blood pressure readings. Have your blood pressure checked: Every 3-5 years if you are 30-75 years of age. Every year if you are 34 years old or older. If you are between the ages of 62 and 2 and are a current  or former smoker, ask your health care provider if you should have a one-time screening for abdominal aortic aneurysm (AAA). Diabetes Have regular diabetes screenings. This checks your fasting blood sugar level. Have the screening done: Once every three years after age 56 if you are at a  normal weight and have a low risk for diabetes. More often and at a younger age if you are overweight or have a high risk for diabetes. What should I know about preventing infection? Hepatitis B If you have a higher risk for hepatitis B, you should be screened for this virus. Talk with your health care provider to find out if you are at risk for hepatitis B infection. Hepatitis C Blood testing is recommended for: Everyone born from [birth year] through 1966. Anyone with known risk factors for hepatitis C. Sexually transmitted infections (STIs) You should be screened each year for STIs, including gonorrhea and chlamydia, if: You are sexually active and are younger than 34 years of age. You are older than 34 years of age and your health care provider tells you that you are at risk for this type of infection. Your sexual activity has changed since you were last screened, and you are at increased risk for chlamydia or gonorrhea. Ask your health care provider if you are at risk. Ask your health care provider about whether you are at high risk for HIV. Your health care provider may recommend a prescription medicine to help prevent HIV infection. If you choose to take medicine to prevent HIV, you should first get tested for HIV. You should then be tested every 3 months for as long as you are taking the medicine. Follow these instructions at home: Alcohol use Do not drink alcohol if your health care provider tells you not to drink. If you drink alcohol: Limit how much you have to 0-2 drinks a day. Know how much alcohol is in your drink. In the [place] one drink equals one 12 oz bottle of beer (355 mL), one 5 oz glass of wine  (148 mL), or one 1 oz glass of hard liquor (44 mL). Lifestyle Do not use any products that contain nicotine or tobacco. These products include cigarettes, chewing tobacco, and vaping devices, such as e-cigarettes. If you need help quitting, ask your health care provider. Do not use street drugs. Do not share needles. Ask your health care provider for help if you need support or information about quitting drugs. General instructions Schedule regular health, dental, and eye exams. Stay current with your vaccines. Tell your health care provider if: You often feel depressed. You have ever been abused or do not feel safe at home. Summary Adopting a healthy lifestyle and getting preventive care are important in promoting health and wellness. Follow your health care provider's instructions about healthy diet, exercising, and getting tested or screened for diseases. Follow your health care provider's instructions on monitoring your cholesterol and blood pressure. This information is not intended to replace advice given to you by your health care provider. Make sure you discuss any questions you have with your health care provider. Document Revised: 12/28/2020 Document Reviewed: 12/28/2020 Elsevier Patient Education  2024 [employer]vier Inc.      Emil Schaumann, MD [facility]

## 2024-04-01 NOTE — Assessment & Plan Note (Signed)
 Clinically euthyroid TSH done today Continue Synthroid  100 mcg daily Wants referral to endocrinologist Referral placed today.

## 2024-04-01 NOTE — Patient Instructions (Signed)
 Health Maintenance, Male  Adopting a healthy lifestyle and getting preventive care are important in promoting health and wellness. Ask your health care provider about:  The right schedule for you to have regular tests and exams.  Things you can do on your own to prevent diseases and keep yourself healthy.  What should I know about diet, weight, and exercise?  Eat a healthy diet    Eat a diet that includes plenty of vegetables, fruits, low-fat dairy products, and lean protein.  Do not eat a lot of foods that are high in solid fats, added sugars, or sodium.  Maintain a healthy weight  Body mass index (BMI) is a measurement that can be used to identify possible weight problems. It estimates body fat based on height and weight. Your health care provider can help determine your BMI and help you achieve or maintain a healthy weight.  Get regular exercise  Get regular exercise. This is one of the most important things you can do for your health. Most adults should:  Exercise for at least 150 minutes each week. The exercise should increase your heart rate and make you sweat (moderate-intensity exercise).  Do strengthening exercises at least twice a week. This is in addition to the moderate-intensity exercise.  Spend less time sitting. Even light physical activity can be beneficial.  Watch cholesterol and blood lipids  Have your blood tested for lipids and cholesterol at 34 years of age, then have this test every 5 years.  You may need to have your cholesterol levels checked more often if:  Your lipid or cholesterol levels are high.  You are older than 35 years of age.  You are at high risk for heart disease.  What should I know about cancer screening?  Many types of cancers can be detected early and may often be prevented. Depending on your health history and family history, you may need to have cancer screening at various ages. This may include screening for:  Colorectal cancer.  Prostate cancer.  Skin cancer.  Lung  cancer.  What should I know about heart disease, diabetes, and high blood pressure?  Blood pressure and heart disease  High blood pressure causes heart disease and increases the risk of stroke. This is more likely to develop in people who have high blood pressure readings or are overweight.  Talk with your health care provider about your target blood pressure readings.  Have your blood pressure checked:  Every 3-5 years if you are 24-52 years of age.  Every year if you are 3 years old or older.  If you are between the ages of 60 and 72 and are a current or former smoker, ask your health care provider if you should have a one-time screening for abdominal aortic aneurysm (AAA).  Diabetes  Have regular diabetes screenings. This checks your fasting blood sugar level. Have the screening done:  Once every three years after age 66 if you are at a normal weight and have a low risk for diabetes.  More often and at a younger age if you are overweight or have a high risk for diabetes.  What should I know about preventing infection?  Hepatitis B  If you have a higher risk for hepatitis B, you should be screened for this virus. Talk with your health care provider to find out if you are at risk for hepatitis B infection.  Hepatitis C  Blood testing is recommended for:  Everyone born from [birth year] through 1966.  Anyone  with known risk factors for hepatitis C.  Sexually transmitted infections (STIs)  You should be screened each year for STIs, including gonorrhea and chlamydia, if:  You are sexually active and are younger than 34 years of age.  You are older than 34 years of age and your health care provider tells you that you are at risk for this type of infection.  Your sexual activity has changed since you were last screened, and you are at increased risk for chlamydia or gonorrhea. Ask your health care provider if you are at risk.  Ask your health care provider about whether you are at high risk for HIV. Your health care provider  may recommend a prescription medicine to help prevent HIV infection. If you choose to take medicine to prevent HIV, you should first get tested for HIV. You should then be tested every 3 months for as long as you are taking the medicine.  Follow these instructions at home:  Alcohol use  Do not drink alcohol if your health care provider tells you not to drink.  If you drink alcohol:  Limit how much you have to 0-2 drinks a day.  Know how much alcohol is in your drink. In the [place] one drink equals one 12 oz bottle of beer (355 mL), one 5 oz glass of wine (148 mL), or one 1 oz glass of hard liquor (44 mL).  Lifestyle  Do not use any products that contain nicotine or tobacco. These products include cigarettes, chewing tobacco, and vaping devices, such as e-cigarettes. If you need help quitting, ask your health care provider.  Do not use street drugs.  Do not share needles.  Ask your health care provider for help if you need support or information about quitting drugs.  General instructions  Schedule regular health, dental, and eye exams.  Stay current with your vaccines.  Tell your health care provider if:  You often feel depressed.  You have ever been abused or do not feel safe at home.  Summary  Adopting a healthy lifestyle and getting preventive care are important in promoting health and wellness.  Follow your health care provider's instructions about healthy diet, exercising, and getting tested or screened for diseases.  Follow your health care provider's instructions on monitoring your cholesterol and blood pressure.  This information is not intended to replace advice given to you by your health care provider. Make sure you discuss any questions you have with your health care provider.  Document Revised: 12/28/2020 Document Reviewed: 12/28/2020  Elsevier Patient Education  2024 [employer].

## 2024-04-11 ENCOUNTER — Other Ambulatory Visit: Payer: Self-pay | Admitting: Emergency Medicine

## 2024-04-11 DIAGNOSIS — E039 Hypothyroidism, unspecified: Secondary | ICD-10-CM

## 2024-04-18 ENCOUNTER — Telehealth: Payer: Self-pay

## 2024-04-18 NOTE — Telephone Encounter (Signed)
 Copied from CRM #[id number]. Topic: Referral - Status >> Apr 18, 2024  1:24 PM Drema MATSU wrote: Reason for CRM: Patient wife is calling to check on patient referral to Endocrinology. She stated that they have not heard anything. >> Apr 18, 2024  1:27 PM Drema MATSU wrote: Patient has not heard from Nutrition Department either.

## 2024-04-19 ENCOUNTER — Other Ambulatory Visit: Payer: Self-pay | Admitting: Radiology

## 2024-04-19 DIAGNOSIS — Z13228 Encounter for screening for other metabolic disorders: Secondary | ICD-10-CM

## 2024-04-19 NOTE — Telephone Encounter (Signed)
 Spoke with Patient and informed of the status of referral and he understood. Patient also stated wanting his wife as 1st to contact regarding appointments he states he is not able to answer the most during the day.   Wife #- [phone]

## 2024-04-30 ENCOUNTER — Ambulatory Visit (INDEPENDENT_AMBULATORY_CARE_PROVIDER_SITE_OTHER)

## 2024-04-30 DIAGNOSIS — J309 Allergic rhinitis, unspecified: Secondary | ICD-10-CM | POA: Diagnosis not present

## 2024-08-09 ENCOUNTER — Telehealth: Payer: Self-pay | Admitting: Emergency Medicine

## 2024-08-09 NOTE — Telephone Encounter (Signed)
 I have fax this over to there office successfully

## 2024-08-09 NOTE — Telephone Encounter (Signed)
 Copied from CRM #[id number]. Topic: General - Other >> Aug 09, 2024 12:26 PM Brian Webster wrote: Reason for CRM: Pt wife Brian Webster called in stating that a referral was placed for a endocrinologist but they never heard back so they found someone else. The endocrinologist offices they located are requesting pt last 2 OV notes, this year and last year lab results, imaging (if there are any) and demographical information. Per pt chart pt was referred to [facility] but they were not notified of the referred offices information. Listed below is the offices that is requesting information that'Webster listed above. Brian Webster stated to contact her if there are any questions.  [facility] Brian Webster [phone]- phone # [phone]- fax # (to the attention of Heather)

## 2024-08-20 ENCOUNTER — Ambulatory Visit: Admitting: Emergency Medicine
